# Patient Record
Sex: Female | Born: 1975 | Race: Black or African American | Hispanic: No | Marital: Married | Smoking: Never smoker
Health system: Southern US, Community
[De-identification: ages and names within clinical notes are randomized; demographics above are authoritative.]

## PROBLEM LIST (undated history)

## (undated) DIAGNOSIS — D219 Benign neoplasm of connective and other soft tissue, unspecified: Secondary | ICD-10-CM

## (undated) DIAGNOSIS — T7840XA Allergy, unspecified, initial encounter: Secondary | ICD-10-CM

## (undated) DIAGNOSIS — Z789 Other specified health status: Secondary | ICD-10-CM

## (undated) DIAGNOSIS — D649 Anemia, unspecified: Secondary | ICD-10-CM

## (undated) DIAGNOSIS — E079 Disorder of thyroid, unspecified: Secondary | ICD-10-CM

## (undated) HISTORY — PX: NO PAST SURGERIES: SHX2092

## (undated) HISTORY — PX: OTHER SURGICAL HISTORY: SHX169

## (undated) HISTORY — DX: Anemia, unspecified: D64.9

## (undated) HISTORY — DX: Disorder of thyroid, unspecified: E07.9

## (undated) HISTORY — PX: WISDOM TOOTH EXTRACTION: SHX21

## (undated) HISTORY — DX: Allergy, unspecified, initial encounter: T78.40XA

## (undated) HISTORY — DX: Benign neoplasm of connective and other soft tissue, unspecified: D21.9

---

## 2013-05-27 LAB — OB RESULTS CONSOLE HIV ANTIBODY (ROUTINE TESTING): HIV: NONREACTIVE

## 2013-05-27 LAB — OB RESULTS CONSOLE GC/CHLAMYDIA
CHLAMYDIA, DNA PROBE: NEGATIVE
Gonorrhea: NEGATIVE

## 2013-05-27 LAB — OB RESULTS CONSOLE RUBELLA ANTIBODY, IGM: RUBELLA: IMMUNE

## 2013-05-27 LAB — OB RESULTS CONSOLE ABO/RH: RH Type: POSITIVE

## 2013-05-27 LAB — OB RESULTS CONSOLE RPR: RPR: NONREACTIVE

## 2013-05-27 LAB — OB RESULTS CONSOLE ANTIBODY SCREEN: Antibody Screen: NEGATIVE

## 2013-05-27 LAB — OB RESULTS CONSOLE HEPATITIS B SURFACE ANTIGEN: Hepatitis B Surface Ag: NEGATIVE

## 2013-07-20 ENCOUNTER — Other Ambulatory Visit: Payer: Self-pay | Admitting: Obstetrics and Gynecology

## 2013-07-20 DIAGNOSIS — N63 Unspecified lump in unspecified breast: Secondary | ICD-10-CM

## 2013-07-22 ENCOUNTER — Other Ambulatory Visit: Payer: Self-pay

## 2013-07-28 NOTE — L&D Delivery Note (Signed)
After admission the pt had pit aug added.She had an epidural placed. She developed late decels when she was 5 cm. She then rapidly completed the first stage. She pushed for 17min and had a SVD of one live viable black female in the ROA position over a second degree mid line tear. PlacentaS/I, short cord, sent to path. Tear closed with 3-0 chromic. EBL-400cc Baby to NBN.

## 2013-08-01 ENCOUNTER — Ambulatory Visit
Admission: RE | Admit: 2013-08-01 | Discharge: 2013-08-01 | Disposition: A | Discharge status: Home / Self Care | Payer: Commercial Managed Care - PPO | Source: Ambulatory Visit | Attending: Obstetrics and Gynecology | Admitting: Obstetrics and Gynecology

## 2013-08-01 ENCOUNTER — Other Ambulatory Visit: Payer: Self-pay | Admitting: Obstetrics and Gynecology

## 2013-08-01 DIAGNOSIS — N63 Unspecified lump in unspecified breast: Secondary | ICD-10-CM

## 2013-11-22 LAB — OB RESULTS CONSOLE GBS: GBS: NEGATIVE

## 2013-12-20 ENCOUNTER — Telehealth (HOSPITAL_COMMUNITY): Payer: Self-pay | Admitting: *Deleted

## 2013-12-20 ENCOUNTER — Encounter (HOSPITAL_COMMUNITY): Payer: Self-pay | Admitting: *Deleted

## 2013-12-20 NOTE — Telephone Encounter (Signed)
Preadmission screen  

## 2013-12-21 ENCOUNTER — Encounter (HOSPITAL_COMMUNITY): Payer: Self-pay | Admitting: *Deleted

## 2013-12-21 ENCOUNTER — Inpatient Hospital Stay (HOSPITAL_COMMUNITY)
Admission: AD | Admit: 2013-12-21 | Discharge: 2013-12-24 | DRG: 775 | Disposition: A | Discharge status: Home / Self Care | Payer: 59 | Source: Ambulatory Visit | Attending: Obstetrics and Gynecology | Admitting: Obstetrics and Gynecology

## 2013-12-21 DIAGNOSIS — Z3480 Encounter for supervision of other normal pregnancy, unspecified trimester: Secondary | ICD-10-CM

## 2013-12-21 DIAGNOSIS — Z3490 Encounter for supervision of normal pregnancy, unspecified, unspecified trimester: Secondary | ICD-10-CM

## 2013-12-21 DIAGNOSIS — D4959 Neoplasm of unspecified behavior of other genitourinary organ: Secondary | ICD-10-CM | POA: Diagnosis present

## 2013-12-21 DIAGNOSIS — O480 Post-term pregnancy: Secondary | ICD-10-CM

## 2013-12-21 DIAGNOSIS — O411090 Infection of amniotic sac and membranes, unspecified, unspecified trimester, not applicable or unspecified: Secondary | ICD-10-CM | POA: Diagnosis present

## 2013-12-21 DIAGNOSIS — O09519 Supervision of elderly primigravida, unspecified trimester: Secondary | ICD-10-CM | POA: Diagnosis present

## 2013-12-21 DIAGNOSIS — D259 Leiomyoma of uterus, unspecified: Secondary | ICD-10-CM | POA: Diagnosis present

## 2013-12-21 DIAGNOSIS — O3410 Maternal care for benign tumor of corpus uteri, unspecified trimester: Secondary | ICD-10-CM

## 2013-12-21 DIAGNOSIS — IMO0001 Reserved for inherently not codable concepts without codable children: Secondary | ICD-10-CM

## 2013-12-21 DIAGNOSIS — O34599 Maternal care for other abnormalities of gravid uterus, unspecified trimester: Secondary | ICD-10-CM | POA: Diagnosis present

## 2013-12-21 HISTORY — DX: Other specified health status: Z78.9

## 2013-12-21 LAB — RPR

## 2013-12-21 LAB — CBC
HCT: 36.4 % (ref 36.0–46.0)
Hemoglobin: 12.8 g/dL (ref 12.0–15.0)
MCH: 31.1 pg (ref 26.0–34.0)
MCHC: 35.2 g/dL (ref 30.0–36.0)
MCV: 88.3 fL (ref 78.0–100.0)
PLATELETS: 166 10*3/uL (ref 150–400)
RBC: 4.12 MIL/uL (ref 3.87–5.11)
RDW: 13.6 % (ref 11.5–15.5)
WBC: 14.7 10*3/uL — AB (ref 4.0–10.5)

## 2013-12-21 MED ORDER — TERBUTALINE SULFATE 1 MG/ML IJ SOLN: 0.2500 mg | Freq: Once | INTRAMUSCULAR | Status: AC | PRN

## 2013-12-21 MED ORDER — CLINDAMYCIN PHOSPHATE 900 MG/50ML IV SOLN
900.0000 mg | Freq: Three times a day (TID) | INTRAVENOUS | Status: DC
  Administered 2013-12-21 – 2013-12-22 (×2): 900 mg via INTRAVENOUS
  Filled 2013-12-21 (×3): qty 50

## 2013-12-21 MED ORDER — ACETAMINOPHEN 325 MG PO TABS
650.0000 mg | ORAL_TABLET | ORAL | Status: DC | PRN
  Administered 2013-12-21 – 2013-12-22 (×2): 650 mg via ORAL
  Filled 2013-12-21 (×3): qty 2

## 2013-12-21 MED ORDER — FLEET ENEMA 7-19 GM/118ML RE ENEM: 1.0000 | ENEMA | RECTAL | Status: DC | PRN

## 2013-12-21 MED ORDER — EPHEDRINE 5 MG/ML INJ
10.0000 mg | INTRAVENOUS | Status: DC | PRN
  Administered 2013-12-22: 10 mg via INTRAVENOUS
  Filled 2013-12-21: qty 2

## 2013-12-21 MED ORDER — LACTATED RINGERS IV SOLN
INTRAVENOUS | Status: DC
  Administered 2013-12-21: 300 mL via INTRAVENOUS

## 2013-12-21 MED ORDER — LACTATED RINGERS IV SOLN
500.0000 mL | Freq: Once | INTRAVENOUS | Status: AC
  Administered 2013-12-21: 500 mL via INTRAVENOUS

## 2013-12-21 MED ORDER — OXYTOCIN 40 UNITS IN LACTATED RINGERS INFUSION - SIMPLE MED: 62.5000 mL/h | INTRAVENOUS | Status: DC

## 2013-12-21 MED ORDER — PHENYLEPHRINE 40 MCG/ML (10ML) SYRINGE FOR IV PUSH (FOR BLOOD PRESSURE SUPPORT)
80.0000 ug | PREFILLED_SYRINGE | INTRAVENOUS | Status: DC | PRN
  Administered 2013-12-22: 80 ug via INTRAVENOUS
  Filled 2013-12-21: qty 2

## 2013-12-21 MED ORDER — PHENYLEPHRINE 40 MCG/ML (10ML) SYRINGE FOR IV PUSH (FOR BLOOD PRESSURE SUPPORT)
80.0000 ug | PREFILLED_SYRINGE | INTRAVENOUS | Status: DC | PRN
  Filled 2013-12-21: qty 10
  Filled 2013-12-21: qty 2

## 2013-12-21 MED ORDER — LACTATED RINGERS IV SOLN
500.0000 mL | INTRAVENOUS | Status: DC | PRN
  Administered 2013-12-21: 1000 mL via INTRAVENOUS
  Administered 2013-12-22: 500 mL via INTRAVENOUS

## 2013-12-21 MED ORDER — LIDOCAINE HCL (PF) 1 % IJ SOLN
30.0000 mL | INTRAMUSCULAR | Status: DC | PRN
  Filled 2013-12-21: qty 30

## 2013-12-21 MED ORDER — FENTANYL 2.5 MCG/ML BUPIVACAINE 1/10 % EPIDURAL INFUSION (WH - ANES)
14.0000 mL/h | INTRAMUSCULAR | Status: DC | PRN
  Administered 2013-12-22: 14 mL/h via EPIDURAL
  Filled 2013-12-21: qty 125

## 2013-12-21 MED ORDER — IBUPROFEN 600 MG PO TABS
600.0000 mg | ORAL_TABLET | Freq: Four times a day (QID) | ORAL | Status: DC | PRN
  Administered 2013-12-22: 600 mg via ORAL
  Filled 2013-12-21: qty 1

## 2013-12-21 MED ORDER — EPHEDRINE 5 MG/ML INJ
10.0000 mg | INTRAVENOUS | Status: DC | PRN
  Filled 2013-12-21: qty 2
  Filled 2013-12-21: qty 4

## 2013-12-21 MED ORDER — CITRIC ACID-SODIUM CITRATE 334-500 MG/5ML PO SOLN
30.0000 mL | ORAL | Status: DC | PRN
  Filled 2013-12-21: qty 15

## 2013-12-21 MED ORDER — OXYTOCIN 40 UNITS IN LACTATED RINGERS INFUSION - SIMPLE MED
1.0000 m[IU]/min | INTRAVENOUS | Status: DC
  Administered 2013-12-21: 2 m[IU]/min via INTRAVENOUS
  Filled 2013-12-21: qty 1000

## 2013-12-21 MED ORDER — OXYTOCIN BOLUS FROM INFUSION
500.0000 mL | INTRAVENOUS | Status: DC
  Administered 2013-12-22: 500 mL via INTRAVENOUS

## 2013-12-21 MED ORDER — DIPHENHYDRAMINE HCL 50 MG/ML IJ SOLN: 12.5000 mg | INTRAMUSCULAR | Status: DC | PRN

## 2013-12-21 MED ORDER — ONDANSETRON HCL 4 MG/2ML IJ SOLN: 4.0000 mg | Freq: Four times a day (QID) | INTRAMUSCULAR | Status: DC | PRN

## 2013-12-21 MED ORDER — OXYCODONE-ACETAMINOPHEN 5-325 MG PO TABS: 1.0000 | ORAL_TABLET | ORAL | Status: DC | PRN

## 2013-12-21 NOTE — MAU Note (Signed)
PT HA SA MARK UNDER LEFT EYE-  SHE SAYS IT  IS A BIRTHMARK   THAT FADED AWAY AS A CHILD AND THEN CAME BACK WITH PREG..   DENIES  ANY ABUSE

## 2013-12-21 NOTE — MAU Note (Signed)
PT SAYS SHE   HAD BAD UC    AT 1PM.-  BLOODY SHOW.  YESTERDAY - IN   OFFICE .  DR ROSS-   1 CM    DENIES HSV AND MRSA.  GBS- NEG.

## 2013-12-21 NOTE — MAU Note (Signed)
Patient states she is having contractions every 4-5 minutes. Denies bleeding or leaking. Reports having membranes sweeped yesterday and scheduled for IOL on Monday. Reports good fetal movement.

## 2013-12-21 NOTE — H&P (Signed)
Pt is a 38 yr old black female, G1P0 at term who presents to the ER with abd pain.When she arrived she had regular contractions. Her cx was 70/2/-2 vtx. She also had SROM. It is noted that her temp was 101.6 and the FHTs were elevated. She did have emesis earlier today. Her uterus is non tender. She did have her membranes stripped in the office yesterday. GBS- negative. PNC was complicated by AMA- normal NIPT, Uterine fibroids PE: Temp 101.6 FHTs 160-170s Abd-gravid, non tender, palp cotractions Pelvic-ext-wnl            Vagina- +pool, +fern, AF-yellow, ?light mec.            Cx-70/2/-2 IMP/ IUP at term with SROM, elevated FHTs, maternal temp. Most likely with chorio but ut is non tender. Given that she did have emesis this am and that her WBC is not significantly elevated, she may have a viral gastroenteritis. PLAN/ Will admit and start abxs, Augment labor if needed.

## 2013-12-22 ENCOUNTER — Encounter (HOSPITAL_COMMUNITY): Payer: 59 | Admitting: Anesthesiology

## 2013-12-22 ENCOUNTER — Encounter (HOSPITAL_COMMUNITY): Payer: Self-pay | Admitting: Obstetrics

## 2013-12-22 ENCOUNTER — Observation Stay (HOSPITAL_COMMUNITY): Payer: 59 | Admitting: Anesthesiology

## 2013-12-22 DIAGNOSIS — IMO0001 Reserved for inherently not codable concepts without codable children: Secondary | ICD-10-CM

## 2013-12-22 DIAGNOSIS — Z3480 Encounter for supervision of other normal pregnancy, unspecified trimester: Secondary | ICD-10-CM

## 2013-12-22 LAB — TYPE AND SCREEN
ABO/RH(D): A POS
ANTIBODY SCREEN: NEGATIVE

## 2013-12-22 LAB — ABO/RH: ABO/RH(D): A POS

## 2013-12-22 MED ORDER — ONDANSETRON HCL 4 MG/2ML IJ SOLN: 4.0000 mg | INTRAMUSCULAR | Status: DC | PRN

## 2013-12-22 MED ORDER — WITCH HAZEL-GLYCERIN EX PADS
1.0000 | MEDICATED_PAD | CUTANEOUS | Status: DC | PRN
Start: 2013-12-22 — End: 2013-12-24

## 2013-12-22 MED ORDER — DIBUCAINE 1 % RE OINT: 1.0000 "application " | TOPICAL_OINTMENT | RECTAL | Status: DC | PRN

## 2013-12-22 MED ORDER — SENNOSIDES-DOCUSATE SODIUM 8.6-50 MG PO TABS
2.0000 | ORAL_TABLET | ORAL | Status: DC
  Administered 2013-12-24: 2 via ORAL
  Filled 2013-12-22 (×2): qty 2

## 2013-12-22 MED ORDER — MEASLES, MUMPS & RUBELLA VAC ~~LOC~~ INJ
0.5000 mL | INJECTION | Freq: Once | SUBCUTANEOUS | Status: DC
Start: 2013-12-23 — End: 2013-12-24
  Filled 2013-12-22: qty 0.5

## 2013-12-22 MED ORDER — OXYCODONE-ACETAMINOPHEN 5-325 MG PO TABS: 1.0000 | ORAL_TABLET | ORAL | Status: DC | PRN

## 2013-12-22 MED ORDER — ONDANSETRON HCL 4 MG PO TABS: 4.0000 mg | ORAL_TABLET | ORAL | Status: DC | PRN

## 2013-12-22 MED ORDER — BENZOCAINE-MENTHOL 20-0.5 % EX AERO
1.0000 "application " | INHALATION_SPRAY | CUTANEOUS | Status: DC | PRN
  Administered 2013-12-23: 1 via TOPICAL
  Filled 2013-12-22: qty 56

## 2013-12-22 MED ORDER — ZOLPIDEM TARTRATE 5 MG PO TABS: 5.0000 mg | ORAL_TABLET | Freq: Every evening | ORAL | Status: DC | PRN

## 2013-12-22 MED ORDER — IBUPROFEN 600 MG PO TABS
600.0000 mg | ORAL_TABLET | Freq: Four times a day (QID) | ORAL | Status: DC
  Administered 2013-12-22 – 2013-12-24 (×7): 600 mg via ORAL
  Filled 2013-12-22 (×10): qty 1

## 2013-12-22 MED ORDER — TETANUS-DIPHTH-ACELL PERTUSSIS 5-2.5-18.5 LF-MCG/0.5 IM SUSP: 0.5000 mL | Freq: Once | INTRAMUSCULAR | Status: DC

## 2013-12-22 MED ORDER — LIDOCAINE HCL (PF) 1 % IJ SOLN
INTRAMUSCULAR | Status: DC | PRN
  Administered 2013-12-22 (×4): 4 mL

## 2013-12-22 MED ORDER — SIMETHICONE 80 MG PO CHEW: 80.0000 mg | CHEWABLE_TABLET | ORAL | Status: DC | PRN

## 2013-12-22 NOTE — Anesthesia Procedure Notes (Signed)
Epidural Patient location during procedure: OB Start time: 12/22/2013 12:10 AM  Staffing Performed by: anesthesiologist   Preanesthetic Checklist Completed: patient identified, site marked, surgical consent, pre-op evaluation, timeout performed, IV checked, risks and benefits discussed and monitors and equipment checked  Epidural Patient position: sitting Prep: site prepped and draped and DuraPrep Patient monitoring: continuous pulse ox and blood pressure Approach: midline Injection technique: LOR air  Needle:  Needle type: Tuohy  Needle gauge: 17 G Needle length: 9 cm and 9 Needle insertion depth: 5.5 cm Catheter type: closed end flexible Catheter size: 19 Gauge Catheter at skin depth: 10.5 cm Test dose: negative  Assessment Events: blood not aspirated, injection not painful, no injection resistance, negative IV test and no paresthesia  Additional Notes Discussed risk of headache, infection, bleeding, nerve injury and failed or incomplete block.  Patient voices understanding and wishes to proceed.  Epidural placed easily on first attempt.  No paresthesia.  Patient tolerated procedure well with no apparent complications.  Charlton Haws, MDReason for block:procedure for pain

## 2013-12-22 NOTE — Anesthesia Preprocedure Evaluation (Signed)
Anesthesia Evaluation  Patient identified by MRN, date of birth, ID band Patient awake    Reviewed: Allergy & Precautions, H&P , NPO status , Patient's Chart, lab work & pertinent test results, reviewed documented beta blocker date and time   History of Anesthesia Complications Negative for: history of anesthetic complications  Airway Mallampati: IV TM Distance: >3 FB Neck ROM: full    Dental  (+) Teeth Intact   Pulmonary neg pulmonary ROS,  breath sounds clear to auscultation        Cardiovascular negative cardio ROS  Rhythm:regular Rate:Normal     Neuro/Psych negative neurological ROS  negative psych ROS   GI/Hepatic negative GI ROS, Neg liver ROS,   Endo/Other  negative endocrine ROS  Renal/GU negative Renal ROS     Musculoskeletal   Abdominal   Peds  Hematology negative hematology ROS (+)   Anesthesia Other Findings   Reproductive/Obstetrics (+) Pregnancy                           Anesthesia Physical Anesthesia Plan  ASA: II  Anesthesia Plan: Epidural   Post-op Pain Management:    Induction:   Airway Management Planned:   Additional Equipment:   Intra-op Plan:   Post-operative Plan:   Informed Consent: I have reviewed the patients History and Physical, chart, labs and discussed the procedure including the risks, benefits and alternatives for the proposed anesthesia with the patient or authorized representative who has indicated his/her understanding and acceptance.     Plan Discussed with:   Anesthesia Plan Comments:         Anesthesia Quick Evaluation

## 2013-12-22 NOTE — Lactation Note (Signed)
This note was copied from the chart of Lauren Charina Portugal. Lactation Consultation Note    Initial consult with this mom of a term baby, now 48 hours old, fed twice at breast, but mom has small breast, soft, with no expressable colostrum. I assisted mom with latching baby, and she was too sleepy to latch. Even with 16 nipple shield, baby would not suckle. I then added formula into shield, with curved tip syringe, and she gradually began sucking and more interested. It took her about 15 minutes or more to finish 7 mls at the breast. Baby continued to suckle after after formula fed, with nipple shield I set up a DEP for mom, decreased her to 21 flanges, and showed her how to set premie setting. Mom advised to pump every 3 hours, for 15 minutes, and to try hand expression after. Mom will need help with applying shield, and giving formula via syringe. Dad will probably be available to help with feedings.   Patient Name: Lauren Guerrero Today's Date: 12/22/2013 Reason for consult: Initial assessment   Maternal Data Formula Feeding for Exclusion: No Infant to breast within first hour of birth: Yes Has patient been taught Hand Expression?: Yes Does the patient have breastfeeding experience prior to this delivery?: No  Feeding Feeding Type: Formula Length of feed: 30 min  LATCH Score/Interventions Latch: Repeated attempts needed to sustain latch, nipple held in mouth throughout feeding, stimulation needed to elicit sucking reflex. Intervention(s): Adjust position;Assist with latch;Breast compression (used 16 nipple shiled filled with formula)  Audible Swallowing: None Intervention(s): Skin to skin;Hand expression  Type of Nipple: Flat Intervention(s): Double electric pump (NS)  Comfort (Breast/Nipple): Soft / non-tender     Hold (Positioning): Assistance needed to correctly position infant at breast and maintain latch. Intervention(s): Breastfeeding basics reviewed;Support Pillows;Position  options;Skin to skin (feeding amounts chart reviewed with mom)  LATCH Score: 5  Lactation Tools Discussed/Used Tools: Pump;Nipple [place] Fuel;Flanges Nipple shield size: 16 Flange Size: Other (comment) (21 flanges) Breast pump type: Double-Electric Breast Pump WIC Program: No (dad a cone emplyee. UMR, will get fee DEP for MOM) Pump Review: Setup, frequency, and cleaning;Milk Storage;Other (comment) (premeie setting) Initiated by:: clee RN at 13 hours post partum Date initiated:: 12/22/13   Consult Status Consult Status: Follow-up Date: 12/23/13 Follow-up type: In-patient    Tonna Corner 12/22/2013, 3:39 PM

## 2013-12-22 NOTE — Anesthesia Postprocedure Evaluation (Signed)
  Anesthesia Post-op Note  Patient: Lauren Guerrero  Procedure(s) Performed: * No procedures listed *  Patient Location: Mother/Baby  Anesthesia Type:Epidural  Level of Consciousness: awake  Airway and Oxygen Therapy: Patient Spontanous Breathing  Post-op Pain: none  Post-op Assessment: Patient's Cardiovascular Status Stable, Respiratory Function Stable, Patent Airway, No signs of Nausea or vomiting, Adequate PO intake, Pain level controlled and No headache  Post-op Vital Signs: Reviewed and stable  Last Vitals:  Filed Vitals:   12/22/13 0600  BP: 97/59  Pulse: 104  Temp: 36.9 C  Resp: 20    Complications: No apparent anesthesia complications

## 2013-12-23 LAB — CBC
HCT: 29.2 % — ABNORMAL LOW (ref 36.0–46.0)
Hemoglobin: 10.2 g/dL — ABNORMAL LOW (ref 12.0–15.0)
MCH: 30.6 pg (ref 26.0–34.0)
MCHC: 34.9 g/dL (ref 30.0–36.0)
MCV: 87.7 fL (ref 78.0–100.0)
PLATELETS: 145 10*3/uL — AB (ref 150–400)
RBC: 3.33 MIL/uL — ABNORMAL LOW (ref 3.87–5.11)
RDW: 13.8 % (ref 11.5–15.5)
WBC: 18.3 10*3/uL — ABNORMAL HIGH (ref 4.0–10.5)

## 2013-12-23 NOTE — Progress Notes (Signed)
Post Partum Day 1 Subjective: no complaints, up ad lib, voiding and tolerating PO  Objective: Blood pressure 100/61, pulse 94, temperature 98.1 F (36.7 C), temperature source Oral, resp. rate 16, height 5' 5.5" (1.664 m), weight 78.926 kg (174 lb), last menstrual period 03/13/2013, SpO2 96.00%, unknown if currently breastfeeding.  Physical Exam:  General: alert, cooperative and appears stated age 38: appropriate Uterine Fundus: firm   Recent Labs  12/21/13 1610 12/23/13 0605  HGB 12.8 10.2*  HCT 36.4 29.2*    Assessment/Plan: Plan for discharge tomorrow Breastfeeding Fever resolved   LOS: 2 days   Ezinne Yogi H. Tzivia Oneil 12/23/2013, 10:00 AM

## 2013-12-24 MED ORDER — OXYCODONE-ACETAMINOPHEN 5-325 MG PO TABS: 2.0000 | ORAL_TABLET | ORAL | Status: DC | PRN

## 2013-12-24 MED ORDER — IBUPROFEN 600 MG PO TABS: 600.0000 mg | ORAL_TABLET | Freq: Four times a day (QID) | ORAL | Status: DC | PRN

## 2013-12-24 MED ORDER — DOCUSATE SODIUM 100 MG PO CAPS: 100.0000 mg | ORAL_CAPSULE | Freq: Two times a day (BID) | ORAL | Status: DC

## 2013-12-24 NOTE — Discharge Summary (Signed)
Obstetric Discharge Summary Reason for Admission: onset of labor Prenatal Procedures: NST and ultrasound Intrapartum Procedures: spontaneous vaginal delivery Postpartum Procedures: none Complications-Operative and Postpartum: 2nd degree perineal laceration, intrapartum fever Hemoglobin  Date Value Ref Range Status  12/23/2013 10.2* 12.0 - 15.0 g/dL Final     REPEATED TO VERIFY     DELTA CHECK NOTED     HCT  Date Value Ref Range Status  12/23/2013 29.2* 36.0 - 46.0 % Final    Physical Exam:  General: alert, cooperative and appears stated age 38: appropriate Uterine Fundus: firm Incision: healing well DVT Evaluation: No evidence of DVT seen on physical exam.  Discharge Diagnoses: Term Pregnancy-delivered and Amnionitis  Discharge Information: Date: 12/24/2013 Activity: pelvic rest Diet: routine Medications: Ibuprofen, Colace and Percocet Condition: improved Instructions: refer to practice specific booklet Discharge to: home Follow-up Information   Follow up with Olga Millers, MD In 4 weeks. (In 4-6 weeks for a post-partum evaluation)    Specialty:  Obstetrics and Gynecology   Contact information:   [place] [phone]       Newborn Data: Live born female  Birth Weight: 7 lb 0.7 oz (3195 g) APGAR: 6, 9  Home with mother.  Lauren Guerrero. Balinda Heacock 12/24/2013, 9:02 AM

## 2013-12-25 ENCOUNTER — Inpatient Hospital Stay (HOSPITAL_COMMUNITY): Admission: RE | Admit: 2013-12-25 | Payer: Commercial Managed Care - PPO | Source: Ambulatory Visit

## 2013-12-28 ENCOUNTER — Ambulatory Visit: Payer: Self-pay

## 2013-12-28 NOTE — Lactation Note (Signed)
This note was copied from the chart of Dideolu Jegede. Infant Lactation Consultation Outpatient Visit Note  Patient Name: Lauren Guerrero Date of Birth: 12/22/2012 Birth Weight:  7 lb 0.7 oz (3195 g) Gestational Age at Delivery: Gestational Age: [redacted]w[redacted]d Type of Delivery: SVB  Breastfeeding History Frequency of Breastfeeding: every 2-4 hours without nipple shield, 1 breast each feeding Length of Feeding: 40 minutes Voids: 10/day Stools: 3/day, yellow in color.   Supplementing / Method: Pumping:  Type of Pump:  Harmony HP and Medela PNS   Frequency:  Has not been pumping  Volume:  none  Comments: Mom here for feeding assessment, help with latch and to instruct Mom on pumping. She has now received her Medela DEBP. However, Mom was 30 minutes late for her appointment, Baby was fed at 12:00 and she did not bring her DEBP, Mom brought her Harmony HP. Mom has been supplementing baby every 2-4 hours with 2 oz of Enfamil. She had very sore nipples when left the hospital. She has been using All Purpose Nipple Cream and reports her nipples are healing. She has been BF without the nipple shield on 1 breast each feeding for 40 minutes every 2-4 hours. Last visit with Peds on Monday or Tuesday, Mom reports Peds instructed her to decrease the amount of supplement by half and increase breastfeeding and to continue this till milk is in and baby feeding frequently. Mom reports her milk came in 2 days ago.    Consultation Evaluation: Mom's nipples are still cracked, but no bleeding observed with feeding today. Baby sleepy at the breast and had recently fed prior to her OP appointment. Therefore, baby was not very active at the breast. Breast soft.   Initial Feeding Assessment: Pre-feed Weight:  7 lb. 3.9 oz/3286 gm Post-feed Weight:  7 lb. 4.3 oz/3296 gm Amount Transferred:  10 ml Comments:  From left breast in cross cradle hold. Mom is able to use breast compression and obtain what appears to be deep  latch. Baby started out with good suckling rhythm with some audible swallows but then became very sleepy at the breast, some non-nutritive suckling observed. Baby was nursing with stimulation for 15 minutes. Mom denied any discomfort. Nipple round when baby came off the breast.   Additional Feeding Assessment: Pre-feed Weight:   7 lb. 4.3 oz/3296 gm Post-feed Weight:  3298 gm.  Amount Transferred:  2 ml.  Comments:  From right breast in cross cradle hold. Mom denies any discomfort with baby at the breast. Baby did not transfer much at this breast, very sleepy. Feel this feeding is affected by recent feeding before visit.   Additional Feeding Assessment: Pre-feed Weight: Post-feed Weight: Amount Transferred: Comments:  Total Breast milk Transferred this Visit: 12 ml.  Total Supplement Given: None, Baby recently fed at 12:00 prior to OP appointment.  Additional Interventions: Discussed feeding plan with Mom till we can see her again next week. Advised not to feed baby so close to OP appointment so baby will be ready to eat. Be on time for appointment or we may have to re-schedule.  Advised to BF every  2-3 hours, keep baby active at the breast for 15-20 minutes each feeding, both breast each feeding.  Post pump after feedings for 15-20 minutes.  Supplement with EBM or formula 45 ml with each feeding till we can see her again on Monday, 01/02/14 and re-assess pre/post weight and assess weight gain. Mom latched baby well today using breast compression, nipple was round when  baby came off the breast. Continue APNC till nipples heal. Limit time at each breast to 30 minutes. No soap on breast, Can use olive oil or coconut oil if dry. Demonstrated how to use hand pump and clean parts. Using display pump, demonstrated to Mom how to set up her DEBP at home and what parts to clean. Mom to bring DEBP to next visit, along with EBM if needed for supplement.  Pump/storage guidelines reviewed. Refer to page 25  of Baby N Me Booklet    Follow-Up Peds on Friday, 12/30/13 OP Lactation f/u on Monday, 01/02/14 at [facility] 12/28/2013, 4:26 PM

## 2014-01-02 ENCOUNTER — Ambulatory Visit (HOSPITAL_COMMUNITY)
Admission: RE | Admit: 2014-01-02 | Discharge: 2014-01-02 | Disposition: A | Discharge status: Home / Self Care | Payer: 59 | Source: Ambulatory Visit | Attending: Obstetrics and Gynecology | Admitting: Obstetrics and Gynecology

## 2014-01-02 NOTE — Lactation Note (Addendum)
Adult Lactation Consultation Outpatient Visit Note   Patient Name: Lauren Guerrero                                                           "[name]" Date of Birth: Oct 26, 1975                                                                       12 days Gestational Age at Delivery: Unknown                                                 Weight today: [phone] Type of Delivery:                                                                                   3 ounce gain in 5 days  Breastfeeding History: Frequency of Breastfeeding: every 1-2 hours Length of Feeding: 30 mins on each Voids: QS Stools: QS  Supplementing / Method: Pumping:  Type of Pump:Pump N Style   Frequency:none, mother states that she has had too much company to begin pumping  Volume:    Comments:Mother has been using APNO, She still has some cracking at back of both nipple shafts. Mother states that she has had so much company from out of town that she has been unable to start pumping. Mother has been breast feedng infant for 30 mins on each breast. She is giving infant 2 1/2 ounces of formula every 2-3 hours. Infant had a 3 ounce gain in 5 days.    Consultation Evaluation: observed mother latch infant. Infant goes on with a shallow latch. We worked on [employer] and infant was able to get better depth. Infant suckles with lots of non nutritive suckling and intermittent swallows for 20 min. Infant transferred 6 ml.   Observed that infant does have a tight upper lip tie. Repeatedly needing to roll lip up and adjust lower jaw. Recommend that if mothers nipples continue to be cracked, to have lip tie clipped.  Initial Feeding Assessment: Pre-feed Weight:3392 Post-feed LTJQZE:0923 Amount Transferred:6 ml Comments:  Additional Feeding Assessment: SNS was sat up on alternate breast with 60 ml of formula. Infant took the entire amt well.  Pre-feed RAQTMA:2633 Post-feed HLKTGY:5638 Amount Transferred:60  ml Comments:   Total Breast milk Transferred this Visit: 6 ml Total Supplement Given: 46ml from SNS and 30 ml with a bottle  Additional Interventions: Recommend that mother use SNS every feeding  Offer infant at least 2 1/2-3 ounces every 2-3 hours If infant doesn't take entire feeding from SNS, offer bottle with the remainder of feeding  Advised mother to begin pumping  Pump at least 6-8 times daily for 20 mins Tips for making milk. Suggested supplement, fenugreek,etc Mother to follow up with [facility] in one week   Follow-Up  June 15 at 2:30    [facility] 01/02/2014, 4:19 PM

## 2014-01-04 ENCOUNTER — Other Ambulatory Visit: Payer: Self-pay | Admitting: Internal Medicine

## 2014-01-05 ENCOUNTER — Other Ambulatory Visit: Payer: Self-pay | Admitting: Internal Medicine

## 2014-01-05 DIAGNOSIS — M545 Low back pain, unspecified: Secondary | ICD-10-CM

## 2014-01-06 ENCOUNTER — Other Ambulatory Visit: Payer: Self-pay | Admitting: Internal Medicine

## 2014-01-06 MED ORDER — CYCLOBENZAPRINE HCL 10 MG PO TABS: 10.0000 mg | ORAL_TABLET | Freq: Three times a day (TID) | ORAL | Status: DC | PRN

## 2014-01-09 ENCOUNTER — Ambulatory Visit (HOSPITAL_COMMUNITY)
Admission: RE | Admit: 2014-01-09 | Discharge: 2014-01-09 | Disposition: A | Discharge status: Home / Self Care | Payer: 59 | Source: Ambulatory Visit | Attending: Obstetrics and Gynecology | Admitting: Obstetrics and Gynecology

## 2014-01-09 NOTE — Lactation Note (Addendum)
Adult Lactation Consultation Outpatient Visit Note  Patient Name: Lauren Guerrero                                        Baby Dideolu Jejede Date of Birth: 11-19-1975                                                  DOB [birth year], BW 7 lb. 0.7 oz Gestational Age at Delivery: Unknown                            Now 70 weeks old Type of Delivery: SVB  Breastfeeding History: Frequency of Breastfeeding: every 2-3 hours Length of Feeding: 20 minutes each breast Voids:  10 per day Stools:  2-3/day, yellow in color  Supplementing / Method: Pumping:  Type of Pump:  Medela PNS   Frequency:  Mom is pre-pumping prior to each feeding   Volume:  10-15 ml. From each breast.   Comments: Mom is here for feeding assessment from f/u OP lactation visit, 12/28/13. Mom continues to complain of sore nipples. Using [facility] for cracking on right nipple and soreness bilateral. Mom reports no bleeding with breastfeeding. Mom started Fenugreek for LMS and reports taking 3 tabs QD.    Consultation Evaluation: On exam, Mom's breast are soft bilateral. Right nipple has some superficial cracks, no scabs or bleeding noted. Left nipple intact. Baby is noted to have short posterior frenulum that is restricting upward and side to side tongue mobility. The upper lip labial frenulum also attaches at the alveolar ridge resulting in upper lip not flanging well.   Initial Feeding Assessment: Pre-feed Weight:  7 lb. 15.1 oz/3602 gm Post-feed Weight:  7 lb. 15.2 oz/3608 gm Amount Transferred:  6 ml Comments:  From right breast with nursing for 15 minutes. Mom needed Homeland assist to obtain more depth with latch. Mom reported pain with initial latch that resolved after the baby nursed. Baby demonstrated off/on a good rhythmic suck, however LC was not hearing audible swallows in spite of what appeared to be good latch. Mom's nipple has slight compression line visible at the end of the feeding.   Additional Feeding Assessment: Pre-feed Weight:   7 lb. 15.2 oz/3608 gm Post-feed Weight:  same Amount Transferred:  0 Comments:  Baby nursed for approx 10 minutes on the left breast in cross cradle but did not transfer any milk. Tried #20 nipple shield to see if baby could obtain more depth with latch and transfer milk. Initiated SNS with formula as supplement to use at breast with nipple shield. Baby however did not sustain good depth with nipple shield and Mom reported she felt stronger suckling without the nipple shield.   Additional Feeding Assessment: Pre-feed Weight:  7 lb. 15.2 oz/3608 gm Post-feed Weight:  8 lb. 0.2 oz/3634 gm Amount Transferred:  26 ml. Formula from SNS at breast Comments: From left breast using SNS at breast with formula. SNS set up with 30 ml of formula, baby transferred 26 ml of formula at the breast using SNS. Mom's nipple was round when baby came off the breast and she reported pain with initial latch that improved/resolved with baby nursing once baby obtained deep latch.  Total Breast milk Transferred this Visit: 47ml Total Supplement Given: 26 ml formula using SNS at breast, took another 60 ml of formula from bottle for total of 86 ml supplement.   Additional Interventions: Encouraged Mom to contact Peds for referral about frenotomy. Upper lip labial frenulum and posterior frenulum can result in poor sucking mechanism which affects milk transfer and milk production. Also will keep Mom sore.  Encouraged Mom to pre-pump 3-5 minutes to get milk flow going. Latch baby on 1st breast without SNS. Keep her nursing for 15-20 minutes. Then latch baby on 2nd breast using SNS to supplement with either EBM or formula with each feeding. Be sure baby has good depth w/latch and demonstrating a good suckling pattern.  Next feeding alternate the pattern so that each feeding you are alternating which breast is the 1st breast and which breast you use with the SNS. Supplement with 60 ml of EBM/formula with each feeding till we can  see you again and assess milk transfer. Can give more if baby wants more.  Post pump both breasts for 15-20 minutes to stimulate milk production. Continue Fenugreek but take 3 tabs TID Eat oatmeal every day, consider Lactation cookies. Can use bottle to supplement if SNS becomes too complicated. Mom to use double SNS at home. Mom reports she has double SNS and has been instructed on how to set up/clean.    Follow-Up Lactation OP f/u Wednesday, 01/18/14 at 1:00pm.      Katrine Coho 01/09/2014, 4:37 PM

## 2014-01-18 ENCOUNTER — Ambulatory Visit (HOSPITAL_COMMUNITY): Payer: Commercial Managed Care - PPO

## 2014-05-03 ENCOUNTER — Other Ambulatory Visit: Payer: Self-pay | Admitting: Internal Medicine

## 2014-05-03 DIAGNOSIS — S63501A Unspecified sprain of right wrist, initial encounter: Secondary | ICD-10-CM

## 2014-05-09 ENCOUNTER — Ambulatory Visit: Payer: Commercial Managed Care - PPO | Admitting: Occupational Therapy

## 2014-05-11 ENCOUNTER — Ambulatory Visit: Payer: 59 | Attending: Internal Medicine | Admitting: Occupational Therapy

## 2014-05-11 DIAGNOSIS — M25532 Pain in left wrist: Secondary | ICD-10-CM | POA: Insufficient documentation

## 2014-05-11 DIAGNOSIS — M25531 Pain in right wrist: Secondary | ICD-10-CM | POA: Insufficient documentation

## 2014-05-12 ENCOUNTER — Ambulatory Visit: Payer: 59 | Admitting: Occupational Therapy

## 2014-05-12 DIAGNOSIS — M25531 Pain in right wrist: Secondary | ICD-10-CM | POA: Diagnosis not present

## 2014-05-12 DIAGNOSIS — M25539 Pain in unspecified wrist: Secondary | ICD-10-CM | POA: Diagnosis present

## 2014-05-12 DIAGNOSIS — M25532 Pain in left wrist: Secondary | ICD-10-CM | POA: Diagnosis not present

## 2014-05-12 NOTE — Progress Notes (Signed)
[facility] assessment and treatment plan for bilateral wrist pain received.  Treatment plan signed by provider and faxed back as requested.

## 2014-05-16 ENCOUNTER — Ambulatory Visit: Payer: 59 | Admitting: Occupational Therapy

## 2014-05-16 DIAGNOSIS — M25532 Pain in left wrist: Secondary | ICD-10-CM | POA: Diagnosis not present

## 2014-05-23 ENCOUNTER — Ambulatory Visit: Payer: 59 | Admitting: Occupational Therapy

## 2014-05-23 DIAGNOSIS — M25532 Pain in left wrist: Secondary | ICD-10-CM | POA: Diagnosis not present

## 2014-05-25 ENCOUNTER — Encounter: Payer: Commercial Managed Care - PPO | Admitting: Occupational Therapy

## 2014-05-29 ENCOUNTER — Encounter (HOSPITAL_COMMUNITY): Payer: Self-pay | Admitting: Obstetrics

## 2014-05-30 ENCOUNTER — Ambulatory Visit: Payer: 59 | Admitting: Occupational Therapy

## 2014-06-06 ENCOUNTER — Ambulatory Visit: Payer: 59 | Attending: Internal Medicine | Admitting: Occupational Therapy

## 2014-06-06 ENCOUNTER — Encounter: Payer: Self-pay | Admitting: Occupational Therapy

## 2014-06-06 DIAGNOSIS — M25539 Pain in unspecified wrist: Secondary | ICD-10-CM

## 2014-06-06 DIAGNOSIS — M25532 Pain in left wrist: Secondary | ICD-10-CM | POA: Diagnosis not present

## 2014-06-06 DIAGNOSIS — M25531 Pain in right wrist: Secondary | ICD-10-CM | POA: Diagnosis not present

## 2014-06-06 DIAGNOSIS — M25649 Stiffness of unspecified hand, not elsewhere classified: Secondary | ICD-10-CM

## 2014-06-06 NOTE — Therapy (Signed)
Occupational Therapy Treatment  Patient Details  Name: Lauren Guerrero MRN: [id number] Date of Birth: 19-Aug-1975  Encounter Date: 06/06/2014      OT End of Session - 06/06/14 1240    Visit Number 4   Number of Visits 16   Date for OT Re-Evaluation 07/10/14   OT Start Time 1155   OT Stop Time 1235   OT Time Calculation (min) 40 min   Activity Tolerance Patient tolerated treatment well  with no increase in pain      Past Medical History  Diagnosis Date  . Anemia   . Medical history non-contributory     Past Surgical History  Procedure Laterality Date  . Wisdom tooth extraction    . No past surgeries      There were no vitals taken for this visit.  Visit Diagnosis:  Pain in joint, forearm, unspecified laterality  Stiffness of joint, hand, unspecified laterality          OT Treatments/Exercises (OP) - 06/06/14 0001    Neurological Re-education Exercises   Other Exercises 1 Performed wrist flex, ext, rad. & ulnar dev seperate from thumb flex/radial abduction x 10 reps each   Other Exercises 2 Pt shown putty ex's (red) for mass grasp and tip pinch bilaterally. Pt performed each x 10 reps but instructed to keep wrist neutral.    Modalities   Modalities Fluidotherapy   LUE Fluidotherapy   Number Minutes Fluidotherapy 10 Minutes   LUE Fluidotherapy Location Hand;Wrist   Comments to decrease pain            OT Short Term Goals - 06/06/14 1242    OT SHORT TERM GOAL #1   Title Independent w/ splint wear and care and activity modification to minimize pain   Baseline due 06/11/14   Time 1   Period Weeks   Status On-going   OT SHORT TERM GOAL #2   Title I with initial HEP   Baseline due 06/11/14   Time 1   Period Weeks   Status On-going   OT SHORT TERM GOAL #3   Title Pt will verbalize understanding of pain reduction strategies   Baseline due 06/11/14   Time 1   Period Weeks   Status On-going          OT Long Term Goals - 06/06/14 1244    OT LONG  TERM GOAL #1   Title Independent w/ updated HEP   Baseline DUE 07/10/14   Status On-going   OT LONG TERM GOAL #2   Title Pt will report UE pain less than or equal to 3/10 bilaterally w/ ADLS/IADLS   Baseline Due 32/99/24   Status On-going   OT LONG TERM GOAL #3   Title Pt will demo wrist extension WNLS for functional use    Baseline Due 07/10/14   Status On-going   OT LONG TERM GOAL #4   Title Pt will demo bilateral thumb MP flexion WFL's for ADLS/IADLS   Baseline DUE 26/83/41   Status On-going          Plan - 06/06/14 1249    Clinical Impression Statement Pt is tolerating AROM ex's and light putty ex's w/ no increase in pain today   Plan continue modalities, AROM, and strengthening as tolerated. Discuss activity modifications        Problem List Patient Active Problem List   Diagnosis Date Noted  . Active labor 12/22/2013  . Supervision of other normal pregnancy 12/22/2013  . Pregnancy 12/21/2013  [name] 06/06/2014, 1:18 PM

## 2014-06-06 NOTE — Patient Instructions (Signed)
Grip Strengthening (Resistive Putty)   Squeeze putty using thumb and all fingers. Repeat __10__ times. Do _2___ sessions per day. Both hands  2. Then roll putty into thick tube and pinch b/t each finger and thumb both hands.   Copyright  VHI. All rights reserved.

## 2014-06-08 ENCOUNTER — Encounter: Payer: Self-pay | Admitting: Occupational Therapy

## 2014-06-08 ENCOUNTER — Ambulatory Visit: Payer: 59 | Admitting: Occupational Therapy

## 2014-06-08 DIAGNOSIS — M25649 Stiffness of unspecified hand, not elsewhere classified: Secondary | ICD-10-CM

## 2014-06-08 DIAGNOSIS — M25532 Pain in left wrist: Secondary | ICD-10-CM | POA: Diagnosis not present

## 2014-06-08 DIAGNOSIS — M25539 Pain in unspecified wrist: Secondary | ICD-10-CM

## 2014-06-08 NOTE — Therapy (Signed)
Occupational Therapy Treatment  Patient Details  Name: Lauren Guerrero MRN: [id number] Date of Birth: Nov 16, 1975  Encounter Date: 06/08/2014      OT End of Session - 06/08/14 1632    Visit Number 5   Number of Visits 16   Date for OT Re-Evaluation 07/10/14  However plans on d/c 06/19/14   Authorization Type UMR   OT Start Time 8657   OT Stop Time 1615   OT Time Calculation (min) 43 min      Past Medical History  Diagnosis Date  . Anemia   . Medical history non-contributory     Past Surgical History  Procedure Laterality Date  . Wisdom tooth extraction    . No past surgeries      There were no vitals taken for this visit.  Visit Diagnosis:  Pain in joint, forearm, unspecified laterality  Stiffness of joint, hand, unspecified laterality      Subjective Assessment - 06/08/14 1551    Symptoms "the putty is doing well" no increase in pain   Currently in Pain? Yes   Pain Score 5    Pain Location Wrist   Pain Orientation Left;Right   Pain Type Chronic pain   Pain Onset More than a month ago   Pain Frequency Constant   Aggravating Factors  twisting motions, lifting   Pain Relieving Factors cold packs, heat, ultrasound            OT Treatments/Exercises (OP) - 06/08/14 1623    ADLs   ADL Education Given Yes   Adaptive Utensils Suggested ergonomic handled knives, vegetable peelers, electric can openers and A/E for jar openers to help reduce pain. Pt provided handouts and recommended where/how to purchase   General Comments Pt shown how to adapt tasks or adapt how to hold objects for cooking tasks/childcare to reduce pain. Also discussed joint protection techniques and other compensatory strategies   Modalities   Modalities Fluidotherapy   LUE Fluidotherapy   Number Minutes Fluidotherapy 12 Minutes   LUE Fluidotherapy Location Hand;Wrist  bilaterally   Comments to decrease pain                Plan - 06/08/14 1636    Clinical Impression Statement  Pt progressing towards all STG's   Plan check STG's next visit, ROM measurments, wrist strengthening w/ light weight if tolerated        Problem List Patient Active Problem List   Diagnosis Date Noted  . Active labor 12/22/2013  . Supervision of other normal pregnancy 12/22/2013  . Pregnancy 12/21/2013   Redmond Baseman, OTR/L [place], [place] [address] Phone: [phone] Fax: [phone]                                             [name] 06/08/2014, 4:40 PM

## 2014-06-14 ENCOUNTER — Ambulatory Visit: Payer: 59 | Admitting: Occupational Therapy

## 2014-06-14 DIAGNOSIS — M25649 Stiffness of unspecified hand, not elsewhere classified: Secondary | ICD-10-CM

## 2014-06-14 DIAGNOSIS — M25532 Pain in left wrist: Secondary | ICD-10-CM | POA: Diagnosis not present

## 2014-06-14 DIAGNOSIS — M25539 Pain in unspecified wrist: Secondary | ICD-10-CM

## 2014-06-14 NOTE — Patient Instructions (Addendum)
Hold a 1 lb weight or small water bottle, bend your wrist up and down ( with palm down, then palm up for each arm). 10-15 reps each arm, each direction 1-2 x day Perform only within pain free range. Stop if increased pain

## 2014-06-14 NOTE — Therapy (Signed)
Occupational Therapy Treatment  Patient Details  Name: Lauren Guerrero MRN: [id number] Date of Birth: Dec 12, 1975  Encounter Date: 06/14/2014      OT End of Session - 06/14/14 1629    Visit Number 7   Number of Visits 16   Date for OT Re-Evaluation 07/10/14   Authorization Type UMR   OT Start Time 1537   OT Stop Time 1615   OT Time Calculation (min) 38 min   Activity Tolerance Patient tolerated treatment well   Behavior During Therapy [facility] for tasks assessed/performed      Past Medical History  Diagnosis Date  . Anemia   . Medical history non-contributory     Past Surgical History  Procedure Laterality Date  . Wisdom tooth extraction    . No past surgeries      There were no vitals taken for this visit.  Visit Diagnosis:  No diagnosis found.      Subjective Assessment - 06/14/14 1550    Currently in Pain? Yes   Pain Score 4    Pain Location Wrist   Pain Orientation Right;Left   Pain Type Chronic pain   Pain Onset More than a month ago   Pain Frequency Constant   Aggravating Factors  twisting, lifting   Pain Relieving Factors cold, heat ultrasound.              OT Education - 06/14/14 1628    Education provided Yes   Education Details gentle wrist strengthening with 1 lb weight   Person(s) Educated Patient   Methods Explanation;Demonstration   Comprehension Verbalized understanding;Returned demonstration          OT Short Term Goals - 06/14/14 1605    OT SHORT TERM GOAL #1   Status Achieved   OT SHORT TERM GOAL #2   Title I with initial HEP   Status Achieved   OT SHORT TERM GOAL #3   Title Pt will verbalize understanding of pain reduction strategies   Baseline due 06/11/14   Status Achieved            Plan - 06/14/14 1630    Clinical Impression Statement Fluidotherapy to bilateral UE's x 10 mins for pain relief, no adverse reactions. Ultrasound 3 MHz, 0.8 w/cm2, 20% x 8 mins to left radial wrist(as pt reports it bothers her more than  right.) Pt was instructed in gentle strengthening, with 1 lb weight for bilateral wrists, 2 setsof 10 reps. Therapist discussed s ways to modifiy dishwashing to minimize wrist pain., and checked progress towards STG's. 3/3 goals met.   Rehab Potential Good   Clinical Impairments Affecting Rehab Potential pain   OT Frequency 2x / week   OT Duration 8 weeks   OT Treatment/Interventions Moist Heat;Fluidtherapy;DME and/or AE instruction;Splinting;Patient/family education;Contrast Bath;Ultrasound;Therapeutic exercise;Therapeutic activities;Cryotherapy;Parrafin;Energy conservation;Manual Therapy   Plan ROM measurements, progress HEP as tolerated, anticipate d/c next week.   OT Home Exercise Plan wrist strengthening   Consulted and Agree with Plan of Care Patient        Problem List Patient Active Problem List   Diagnosis Date Noted  . Active labor 12/22/2013  . Supervision of other normal pregnancy 12/22/2013  . Pregnancy 12/21/2013                                            Theone Murdoch, OTR/L  RINE,KATHRYN 06/14/2014, 4:40 PM

## 2014-06-16 ENCOUNTER — Ambulatory Visit: Payer: 59 | Admitting: Occupational Therapy

## 2014-06-16 DIAGNOSIS — M25649 Stiffness of unspecified hand, not elsewhere classified: Secondary | ICD-10-CM

## 2014-06-16 DIAGNOSIS — M25532 Pain in left wrist: Secondary | ICD-10-CM | POA: Diagnosis not present

## 2014-06-16 DIAGNOSIS — M25539 Pain in unspecified wrist: Secondary | ICD-10-CM

## 2014-06-16 NOTE — Therapy (Signed)
Occupational Therapy Treatment  Patient Details  Name: Lauren Guerrero MRN: [id number] Date of Birth: Oct 09, 1975  Encounter Date: 06/16/2014      OT End of Session - 06/16/14 1435    Visit Number 8   Number of Visits 16   Date for OT Re-Evaluation 07/10/14   Authorization Type UMR   OT Start Time 1408   OT Stop Time 1445   OT Time Calculation (min) 37 min   Activity Tolerance Patient tolerated treatment well   Behavior During Therapy [facility] for tasks assessed/performed      Past Medical History  Diagnosis Date  . Anemia   . Medical history non-contributory     Past Surgical History  Procedure Laterality Date  . Wisdom tooth extraction    . No past surgeries      There were no vitals taken for this visit.  Visit Diagnosis:  No diagnosis found.      Subjective Assessment - 06/16/14 1436    Currently in Pain? Yes   Pain Score 3    Pain Location Wrist   Pain Orientation Left   Pain Type Chronic pain   Pain Onset More than a month ago   Pain Frequency Constant   Aggravating Factors  twisting, lifting   Effect of Pain on Daily Activities cold, heat, ultrasound   Multiple Pain Sites Yes   Pain Score 2   Pain Type Chronic pain   Pain Location Wrist   Pain Orientation Right   Pain Descriptors / Indicators Aching   Pain Frequency Constant            OT Treatments/Exercises (OP) - 06/16/14 0001    LUE Fluidotherapy   Number Minutes Fluidotherapy 10 Minutes   LUE Fluidotherapy Location Hand;Wrist   Comments strenght: RUE 79 lbs, LUE 63lbs          OT Education - 06/16/14 1508    Education provided Yes   Education Details reveiwed wrist strengthening   Person(s) Educated Patient   Methods Explanation   Comprehension Verbalized understanding;Returned demonstration            OT Long Term Goals - 06/16/14 1510    OT LONG TERM GOAL #1   Title Independent w/ updated HEP   Baseline DUE 07/10/14   Time 1   Period Weeks   Status On-going   OT LONG  TERM GOAL #2   Title Pt will report UE pain less than or equal to 3/10 bilaterally w/ ADLS/IADLS   Baseline Due 32/67/12   Time 1   Period Weeks   Status On-going   OT LONG TERM GOAL #3   Title Pt will demo wrist extension WNLS for functional use    Baseline current :wrist extension bilaterally:60   Status Achieved   OT LONG TERM GOAL #4   Title Pt will demo bilateral thumb MP flexion WFL's for ADLS/IADLS   Baseline DUE 45/80/99   Time 1   Period Weeks   Status On-going          Plan - 06/16/14 1517    Clinical Impression Statement Fluidotherapy to bilateral UE's x 10 mins no adverse reactions. Ultrasound 3MHz,0.8wcm2, 20% x 8 mins to left radial wrist, no adverse reactions. Reviewed strengthening HEP with 1 lb weight, reviewed theraputty HEP with biateral UE's. Discussed activity modification for home mangement and child care, foam grip provided for hand writing.   Rehab Potential Good   Clinical Impairments Affecting Rehab Potential pain   OT Frequency 2x /  week   OT Duration 8 weeks   OT Treatment/Interventions Moist Heat;Fluidtherapy;DME and/or AE instruction;Splinting;Patient/family education;Contrast Bath;Ultrasound;Therapeutic exercise;Therapeutic activities;Cryotherapy;Parrafin;Energy conservation;Manual Therapy   Plan Check goals, anticipate d/c   OT Home Exercise Plan wrist strengthening   Consulted and Agree with Plan of Care Patient        Problem List Patient Active Problem List   Diagnosis Date Noted  . Active labor 12/22/2013  . Supervision of other normal pregnancy 12/22/2013  . Pregnancy 12/21/2013    A/ROM: wrist flexion/ extension RUE: 86/60, LUE: 85/60.                                         Theone Murdoch, OTR/L [phone] Phone: [phone] 3:29 PM 06/16/2014  RINE,KATHRYN 06/16/2014, 3:29 PM

## 2014-06-19 ENCOUNTER — Ambulatory Visit: Payer: 59 | Admitting: Occupational Therapy

## 2014-06-19 DIAGNOSIS — M25649 Stiffness of unspecified hand, not elsewhere classified: Secondary | ICD-10-CM

## 2014-06-19 DIAGNOSIS — M25532 Pain in left wrist: Secondary | ICD-10-CM | POA: Diagnosis not present

## 2014-06-19 DIAGNOSIS — M25539 Pain in unspecified wrist: Secondary | ICD-10-CM

## 2014-06-19 NOTE — Therapy (Signed)
Occupational Therapy Treatment  Patient Details  Name: Lauren Guerrero MRN: [id number] Date of Birth: Mar 07, 1976  Encounter Date: 06/19/2014      OT End of Session - 06/19/14 1450    Visit Number 9   Number of Visits 16   Authorization Type UMR   OT Start Time 1406   OT Stop Time 1440   OT Time Calculation (min) 34 min   Activity Tolerance Patient tolerated treatment well   Behavior During Therapy [facility] for tasks assessed/performed      Past Medical History  Diagnosis Date  . Anemia   . Medical history non-contributory     Past Surgical History  Procedure Laterality Date  . Wisdom tooth extraction    . No past surgeries      There were no vitals taken for this visit.  Visit Diagnosis:  Pain in joint, forearm, unspecified laterality  Stiffness of joint, hand, unspecified laterality      Subjective Assessment - 06/19/14 1409    Currently in Pain? Yes   Pain Score 3    Pain Orientation Left   Pain Descriptors / Indicators Aching   Pain Type Chronic pain   Pain Onset More than a month ago   Pain Frequency Constant   Aggravating Factors  twisting, lifting   Pain Relieving Factors cold, heat ultrasound   Multiple Pain Sites Yes   Pain Score 2   Pain Type Chronic pain   Pain Location Wrist   Pain Orientation Right   Pain Descriptors / Indicators Aching                OT Short Term Goals - 06/19/14 1452    OT SHORT TERM GOAL #1   Title Independent w/ splint wear and care and activity modification to minimize pain   Status Achieved   OT SHORT TERM GOAL #2   Title I with initial HEP   Status Achieved   OT SHORT TERM GOAL #3   Title Pt will verbalize understanding of pain reduction strategies   Status Achieved          OT Long Term Goals - 06/19/14 1412    OT LONG TERM GOAL #1   Title Independent w/ updated HEP   Status Achieved   OT LONG TERM GOAL #2   Title Pt will report UE pain less than or equal to 3/10 bilaterally w/ ADLS/IADLS   Status  Achieved   OT LONG TERM GOAL #3   Title Pt will demo wrist extension WNLS for functional use    Baseline current :wrist extension bilaterally:60   Status Achieved   OT LONG TERM GOAL #4   Title Pt will demo bilateral thumb MP flexion WFL's for ADLS/IADLS   Status Achieved          Plan - 06/19/14 1452    Clinical Impression Statement Fluidotherapy x 31mns to bilateral UE's, no adverse reactions, checked progress towards long term goals. Reviewed wrist strengthening exercises with 2 lbs weight 10-20 reps each for wrist flexion/ extension bilaterally. Ultrasound 3 MHz, 0.8 wcm2, 20% x 8 mins to left radial wrist no adverse reactions. Reinforced use of wrist braces, rest, and ice/ heat if her pain increases again. Pt agrees with d/c.   Rehab Potential Good   Clinical Impairments Affecting Rehab Potential pain   OT Frequency 2x / week   OT Duration 8 weeks   OT Treatment/Interventions Moist Heat;Fluidtherapy;DME and/or AE instruction;Splinting;Patient/family education;Contrast Bath;Ultrasound;Therapeutic exercise;Therapeutic activities;Cryotherapy;Parrafin;Energy conservation;Manual Therapy   Plan d/c  OT   OT Home Exercise Plan wrist strengthening   Recommended Other Services Pt agrees with d/c   Consulted and Agree with Plan of Care Patient        Problem List Patient Active Problem List   Diagnosis Date Noted  . Active labor 12/22/2013  . Supervision of other normal pregnancy 12/22/2013  . Pregnancy 12/21/2013               OCCUPATIONAL THERAPY DISCHARGE SUMMARY  Remaining deficits: Pt presents with mild bilateral wrist pain however significantly improved since evaluation.   Education / Equipment: Pt was educated regarding the following: HEP, splint wear, care and precautions, activity modification to minimize pain. Pt verbalizes understanding of all education.   Plan: Patient agrees to discharge.  Patient goals were not met. Patient is being discharged due to  meeting the stated rehab goals.  ?????                                   Theone Murdoch, OTR/L [phone] Phone: [phone] 3:01 PM 06/19/2014  RINE,KATHRYN 06/19/2014, 3:01 PM

## 2015-07-29 LAB — HM PAP SMEAR

## 2016-06-12 DIAGNOSIS — Z6827 Body mass index (BMI) 27.0-27.9, adult: Secondary | ICD-10-CM | POA: Diagnosis not present

## 2016-06-12 DIAGNOSIS — N970 Female infertility associated with anovulation: Secondary | ICD-10-CM | POA: Diagnosis not present

## 2016-06-12 DIAGNOSIS — Z01419 Encounter for gynecological examination (general) (routine) without abnormal findings: Secondary | ICD-10-CM | POA: Diagnosis not present

## 2016-08-26 DIAGNOSIS — N840 Polyp of corpus uteri: Secondary | ICD-10-CM | POA: Diagnosis not present

## 2016-08-26 DIAGNOSIS — Z319 Encounter for procreative management, unspecified: Secondary | ICD-10-CM | POA: Diagnosis not present

## 2016-09-19 DIAGNOSIS — Z319 Encounter for procreative management, unspecified: Secondary | ICD-10-CM | POA: Diagnosis not present

## 2016-09-19 DIAGNOSIS — N840 Polyp of corpus uteri: Secondary | ICD-10-CM | POA: Diagnosis not present

## 2016-12-11 DIAGNOSIS — N840 Polyp of corpus uteri: Secondary | ICD-10-CM | POA: Diagnosis not present

## 2016-12-13 DIAGNOSIS — Z319 Encounter for procreative management, unspecified: Secondary | ICD-10-CM | POA: Diagnosis not present

## 2017-01-06 DIAGNOSIS — Z319 Encounter for procreative management, unspecified: Secondary | ICD-10-CM | POA: Diagnosis not present

## 2017-01-09 DIAGNOSIS — Z3189 Encounter for other procreative management: Secondary | ICD-10-CM | POA: Diagnosis not present

## 2017-01-23 MED FILL — LETROZOLE 2.5 MG TABLET: 2.5 | 5 days supply | Qty: 15 | Fill #0

## 2017-02-02 DIAGNOSIS — Z319 Encounter for procreative management, unspecified: Secondary | ICD-10-CM | POA: Diagnosis not present

## 2017-02-19 MED FILL — LETROZOLE 2.5 MG TABLET: 2.5 | 5 days supply | Qty: 15 | Fill #1

## 2017-02-27 DIAGNOSIS — Z319 Encounter for procreative management, unspecified: Secondary | ICD-10-CM | POA: Diagnosis not present

## 2017-03-03 DIAGNOSIS — Z3189 Encounter for other procreative management: Secondary | ICD-10-CM | POA: Diagnosis not present

## 2017-04-09 MED FILL — LETROZOLE 2.5 MG TABLET: 2.5 | 5 days supply | Qty: 15 | Fill #0

## 2017-04-20 DIAGNOSIS — Z3183 Encounter for assisted reproductive fertility procedure cycle: Secondary | ICD-10-CM | POA: Diagnosis not present

## 2017-04-23 DIAGNOSIS — Z3189 Encounter for other procreative management: Secondary | ICD-10-CM | POA: Diagnosis not present

## 2017-06-05 DIAGNOSIS — N925 Other specified irregular menstruation: Secondary | ICD-10-CM | POA: Diagnosis not present

## 2017-06-08 DIAGNOSIS — O021 Missed abortion: Secondary | ICD-10-CM | POA: Diagnosis not present

## 2017-06-09 MED FILL — ACETAMINOPHEN/COD #3 TABLET: 300-30 | 2 days supply | Qty: 10 | Fill #0

## 2017-06-09 MED FILL — miSOPROStol 200 MCG TABS: 200 | 1 days supply | Qty: 4 | Fill #0

## 2017-06-29 DIAGNOSIS — N939 Abnormal uterine and vaginal bleeding, unspecified: Secondary | ICD-10-CM | POA: Diagnosis not present

## 2017-07-02 MED FILL — NORG-ETHIN ESTRA 0.25-0.035: 0.25-35 | 84 days supply | Qty: 84 | Fill #0

## 2017-08-05 DIAGNOSIS — E288 Other ovarian dysfunction: Secondary | ICD-10-CM | POA: Diagnosis not present

## 2017-08-05 DIAGNOSIS — Z319 Encounter for procreative management, unspecified: Secondary | ICD-10-CM | POA: Diagnosis not present

## 2017-08-14 ENCOUNTER — Ambulatory Visit: Payer: Self-pay | Admitting: Family Medicine

## 2017-08-28 ENCOUNTER — Ambulatory Visit: Payer: Self-pay | Admitting: Family Medicine

## 2017-09-18 DIAGNOSIS — Z3141 Encounter for fertility testing: Secondary | ICD-10-CM | POA: Diagnosis not present

## 2017-09-18 MED FILL — GONAL-F 1,050 UNITS VIAL: 1050 | 7 days supply | Qty: 2 | Fill #0

## 2017-09-18 MED FILL — ESTRADIOL 2 MG TABS: 2 | 30 days supply | Qty: 60 | Fill #0

## 2017-09-18 MED FILL — CETROTIDE 0.25 MG KIT: 0.25 | 4 days supply | Qty: 4 | Fill #0

## 2017-09-18 MED FILL — ESTRADIOL 0.1 MG PATCH: 0.1 | 28 days supply | Qty: 8 | Fill #0

## 2017-09-18 MED FILL — GONAL-F 450 UNIT SOLR: 450 | 1 days supply | Qty: 1 | Fill #0

## 2017-09-18 MED FILL — METHYLPREDNISOLONE 4 MG TAB: 4 | 4 days supply | Qty: 16 | Fill #0

## 2017-09-18 MED FILL — DOXYCYCLINE HYCLATE 100 MG: 100 | 5 days supply | Qty: 10 | Fill #0

## 2017-09-18 MED FILL — MENOPUR 75 UNIT VIAL: 75 | 10 days supply | Qty: 10 | Fill #0

## 2017-09-18 MED FILL — PROGESTERONE OIL 50 MG/ML V: 50 | 30 days supply | Qty: 30 | Fill #0

## 2017-09-28 MED FILL — PREGNYL 10,000 UNITS VIAL: 10000 | 1 days supply | Qty: 1 | Fill #0

## 2017-09-30 MED FILL — DOXYCYCLINE HYCLATE 100 MG: 100 | 20 days supply | Qty: 40 | Fill #0

## 2017-10-05 ENCOUNTER — Encounter: Payer: Self-pay | Admitting: Family Medicine

## 2017-10-05 ENCOUNTER — Other Ambulatory Visit: Payer: Self-pay

## 2017-10-05 ENCOUNTER — Ambulatory Visit (INDEPENDENT_AMBULATORY_CARE_PROVIDER_SITE_OTHER): Payer: No Typology Code available for payment source | Admitting: Family Medicine

## 2017-10-05 VITALS — BP 106/72 | HR 76 | Temp 98.1°F | Resp 16 | Ht 66.0 in | Wt 173.0 lb

## 2017-10-05 DIAGNOSIS — R239 Unspecified skin changes: Secondary | ICD-10-CM

## 2017-10-05 DIAGNOSIS — E042 Nontoxic multinodular goiter: Secondary | ICD-10-CM | POA: Insufficient documentation

## 2017-10-05 DIAGNOSIS — Z91010 Allergy to peanuts: Secondary | ICD-10-CM | POA: Diagnosis not present

## 2017-10-05 DIAGNOSIS — Z0000 Encounter for general adult medical examination without abnormal findings: Secondary | ICD-10-CM

## 2017-10-05 LAB — CBC WITH DIFFERENTIAL/PLATELET
Basophils Absolute: 0 10*3/uL (ref 0.0–0.1)
Basophils Relative: 0.7 % (ref 0.0–3.0)
Eosinophils Absolute: 0 10*3/uL (ref 0.0–0.7)
Eosinophils Relative: 1 % (ref 0.0–5.0)
HEMATOCRIT: 36.1 % (ref 36.0–46.0)
Hemoglobin: 12.3 g/dL (ref 12.0–15.0)
LYMPHS PCT: 38 % (ref 12.0–46.0)
Lymphs Abs: 1.3 10*3/uL (ref 0.7–4.0)
MCHC: 34.1 g/dL (ref 30.0–36.0)
MCV: 86.8 fl (ref 78.0–100.0)
MONOS PCT: 5.6 % (ref 3.0–12.0)
Monocytes Absolute: 0.2 10*3/uL (ref 0.1–1.0)
Neutro Abs: 1.9 10*3/uL (ref 1.4–7.7)
Neutrophils Relative %: 54.7 % (ref 43.0–77.0)
Platelets: 265 10*3/uL (ref 150.0–400.0)
RBC: 4.16 Mil/uL (ref 3.87–5.11)
RDW: 13 % (ref 11.5–15.5)
WBC: 3.5 10*3/uL — AB (ref 4.0–10.5)

## 2017-10-05 LAB — HEPATIC FUNCTION PANEL
ALBUMIN: 4.3 g/dL (ref 3.5–5.2)
ALT: 12 U/L (ref 0–35)
AST: 15 U/L (ref 0–37)
Alkaline Phosphatase: 45 U/L (ref 39–117)
BILIRUBIN TOTAL: 0.4 mg/dL (ref 0.2–1.2)
Bilirubin, Direct: 0.1 mg/dL (ref 0.0–0.3)
Total Protein: 7 g/dL (ref 6.0–8.3)

## 2017-10-05 LAB — BASIC METABOLIC PANEL
BUN: 14 mg/dL (ref 6–23)
CO2: 27 mEq/L (ref 19–32)
CREATININE: 0.67 mg/dL (ref 0.40–1.20)
Calcium: 9.5 mg/dL (ref 8.4–10.5)
Chloride: 104 mEq/L (ref 96–112)
GFR: 124.87 mL/min (ref >60.00)
Glucose, Bld: 88 mg/dL (ref 70–99)
Potassium: 4.1 mEq/L (ref 3.5–5.1)
Sodium: 137 mEq/L (ref 135–145)

## 2017-10-05 LAB — LIPID PANEL
Cholesterol: 181 mg/dL (ref 0–200)
HDL: 68.3 mg/dL (ref >39.00)
LDL CALC: 102 mg/dL — AB (ref 0–99)
NONHDL: 112.67
TRIGLYCERIDES: 54 mg/dL (ref 0.0–149.0)
Total CHOL/HDL Ratio: 3
VLDL: 10.8 mg/dL (ref 0.0–40.0)

## 2017-10-05 MED ORDER — EPINEPHRINE 0.3 MG/0.3ML IJ SOAJ: 0.3000 mg | Freq: Once | INTRAMUSCULAR | 3 refills | Status: AC

## 2017-10-05 MED FILL — EPINEPHRINE 0.3 MG AUTO-INJ: 0.3 | 2 days supply | Qty: 2 | Fill #0

## 2017-10-05 NOTE — Patient Instructions (Signed)
Follow up in 1 year or as needed We'll notify you of your lab results and make any changes if needed Keep up the good work on healthy diet and regular exercise- you look great!!! We'll call you with your ultrasound appt for the thyroid They should call you with your dermatology appt Call with any questions or concerns Welcome!  We're glad to have you!!! GOOD LUCK!!!

## 2017-10-05 NOTE — Assessment & Plan Note (Signed)
New to provider, pt has had [place] previously which noted thyroid nodules.  Will get [place] to reassess.  Pt expressed understanding and is in agreement w/ plan.

## 2017-10-05 NOTE — Progress Notes (Signed)
   Subjective:    Patient ID: Lauren Guerrero, female    DOB: [birth year], 42 y.o.   MRN: [id number]  HPI New to establish.  No recent PCP.  GYNPhilis Pique.  Fertility- Dr Tasia Catchings.  CPE- no concerns today.  UTD w/ GYN.     Review of Systems Patient reports no vision/ hearing changes, adenopathy,fever, weight change,  persistant/recurrent hoarseness , swallowing issues, chest pain, palpitations, edema, persistant/recurrent cough, hemoptysis, dyspnea (rest/exertional/paroxysmal nocturnal), gastrointestinal bleeding (melena, rectal bleeding), abdominal pain, significant heartburn, bowel changes, GU symptoms (dysuria, hematuria, incontinence), Gyn symptoms (abnormal  bleeding, pain),  syncope, focal weakness, memory loss, numbness & tingling, hair/nail changes, abnormal bruising or bleeding, anxiety, or depression.     L shoulder skin changes- come and go, some itching.  Asking for derm referral.  Objective:   Physical Exam General Appearance:    Alert, cooperative, no distress, appears stated age  Head:    Normocephalic, without obvious abnormality, atraumatic  Eyes:    PERRL, conjunctiva/corneas clear, EOM's intact, fundi    benign, both eyes  Ears:    Normal TM's and external ear canals, both ears  Nose:   Nares normal, septum midline, mucosa normal, no drainage    or sinus tenderness  Throat:   Lips, mucosa, and tongue normal; teeth and gums normal  Neck:   Supple, symmetrical, trachea midline, no adenopathy;    Thyroid: mild fullness  Back:     Symmetric, no curvature, ROM normal, no CVA tenderness  Lungs:     Clear to auscultation bilaterally, respirations unlabored  Chest Wall:    No tenderness or deformity   Heart:    Regular rate and rhythm, S1 and S2 normal, no murmur, rub   or gallop  Breast Exam:    Deferred to GYN  Abdomen:     Soft, non-tender, bowel sounds active all four quadrants,    no masses, no organomegaly  Genitalia:    Deferred to GYN  Rectal:      Extremities:   Extremities normal, atraumatic, no cyanosis or edema  Pulses:   2+ and symmetric all extremities  Skin:   Skin color, texture, turgor normal, no rashes or lesions.  Pt has Mongolian spot under L eye and hyperpigmentation of L shoulder  Lymph nodes:   Cervical, supraclavicular, and axillary nodes normal  Neurologic:   CNII-XII intact, normal strength, sensation and reflexes    throughout          Assessment & Plan:

## 2017-10-05 NOTE — Assessment & Plan Note (Signed)
Pt's PE WNL w/ exception of skin changes on L shoulder.  UTD on GYN.  Refer to derm at pt's request.  UTD on Tdap.  Check labs today.  Anticipatory guidance provided.

## 2017-10-05 NOTE — Assessment & Plan Note (Signed)
Epipen provided

## 2017-10-06 LAB — VITAMIN D 25 HYDROXY (VIT D DEFICIENCY, FRACTURES): VITD: 23.4 ng/mL — AB (ref 30.00–100.00)

## 2017-10-06 LAB — TSH: TSH: 1.09 u[IU]/mL (ref 0.35–4.50)

## 2017-10-07 ENCOUNTER — Other Ambulatory Visit: Payer: Self-pay | Admitting: General Practice

## 2017-10-07 MED ORDER — VITAMIN D (ERGOCALCIFEROL) 1.25 MG (50000 UNIT) PO CAPS: 50000.0000 [IU] | ORAL_CAPSULE | ORAL | 0 refills | Status: DC

## 2017-10-07 MED FILL — VIT D2 1.25 MG (50,000 UNIT: 1.25 MG | 84 days supply | Qty: 12 | Fill #0

## 2017-10-08 MED FILL — GONAL-F 1,050 UNITS VIAL: 1050 | 3 days supply | Qty: 1 | Fill #0

## 2017-10-12 MED FILL — CETROTIDE 0.25 MG KIT: 0.25 | 4 days supply | Qty: 4 | Fill #1

## 2017-10-13 MED FILL — PROMETHAZINE 12.5 MG TABLET: 12.5 | 2 days supply | Qty: 10 | Fill #0

## 2017-10-13 MED FILL — OXYCOD/ACETAMINOPHEN 5-325M: 5-325 | 2 days supply | Qty: 10 | Fill #0

## 2017-12-12 MED FILL — ESTRADIOL 0.1 MG PATCH: 0.1 | 28 days supply | Qty: 8 | Fill #1

## 2017-12-18 MED FILL — METHYLPREDNISOLONE 4 MG TAB: 4 | 1 days supply | Qty: 4 | Fill #0

## 2017-12-25 MED FILL — ESTRADIOL 0.1 MG PATCH: 0.1 | 28 days supply | Qty: 8 | Fill #2 | Status: TO

## 2017-12-25 MED FILL — ESTRADIOL 2 MG TABS: 2 | 30 days supply | Qty: 60 | Fill #1

## 2017-12-31 MED FILL — PROGESTERONE OIL 50 MG/ML V: 50 | 30 days supply | Qty: 30 | Fill #1

## 2018-01-15 MED FILL — ESTRADIOL 0.1 MG PATCH: 0.1 | 28 days supply | Qty: 8 | Fill #0

## 2018-02-04 MED FILL — PROGESTERONE MICRONIZED 200: 200 | 14 days supply | Qty: 14 | Fill #0

## 2018-02-11 LAB — HM PAP SMEAR

## 2018-02-15 ENCOUNTER — Encounter: Payer: Self-pay | Admitting: General Practice

## 2018-02-26 LAB — OB RESULTS CONSOLE RPR: RPR: NONREACTIVE

## 2018-02-26 LAB — OB RESULTS CONSOLE ABO/RH: RH Type: POSITIVE

## 2018-02-26 LAB — OB RESULTS CONSOLE GC/CHLAMYDIA
Chlamydia: NEGATIVE
Gonorrhea: NEGATIVE

## 2018-02-26 LAB — OB RESULTS CONSOLE HEPATITIS B SURFACE ANTIGEN: HEP B S AG: NEGATIVE

## 2018-02-26 LAB — OB RESULTS CONSOLE ANTIBODY SCREEN: Antibody Screen: NEGATIVE

## 2018-02-26 LAB — OB RESULTS CONSOLE HIV ANTIBODY (ROUTINE TESTING): HIV: NONREACTIVE

## 2018-02-26 LAB — OB RESULTS CONSOLE RUBELLA ANTIBODY, IGM: Rubella: IMMUNE

## 2018-05-04 ENCOUNTER — Encounter: Payer: Self-pay | Admitting: Family Medicine

## 2018-05-04 ENCOUNTER — Other Ambulatory Visit: Payer: Self-pay

## 2018-05-04 ENCOUNTER — Ambulatory Visit (INDEPENDENT_AMBULATORY_CARE_PROVIDER_SITE_OTHER): Payer: No Typology Code available for payment source | Admitting: Family Medicine

## 2018-05-04 VITALS — BP 110/62 | HR 93 | Temp 98.7°F | Resp 16 | Ht 66.0 in | Wt 188.1 lb

## 2018-05-04 DIAGNOSIS — J9801 Acute bronchospasm: Secondary | ICD-10-CM

## 2018-05-04 MED ORDER — ALBUTEROL SULFATE HFA 108 (90 BASE) MCG/ACT IN AERS
2.0000 | INHALATION_SPRAY | Freq: Four times a day (QID) | RESPIRATORY_TRACT | 2 refills | Status: DC | PRN
Start: 2018-05-04 — End: 2018-08-31

## 2018-05-04 NOTE — Patient Instructions (Signed)
Follow up as needed or as scheduled Use the Albuterol inhaler- 2 puffs as needed for spasmodic/episodic cough Tea with honey to help control coughing Drink plenty of fluids REST! Call with any questions or concerns Hang in there! CONGRATS ON THE PREGNANCY!!!

## 2018-05-04 NOTE — Progress Notes (Signed)
   Subjective:    Patient ID: Lauren Guerrero, female    DOB: Nov 30, 1975, 42 y.o.   MRN: [id number]  HPI Cough- first developed sxs in early August and this lasted for 3-4 weeks.  Then developed a 2nd cold in September that lasted for a few weeks.  Was feeling better until last week when she again began coughing.  No fevers.  No current sinus pain/pressure.  No ear pain.  + SOB but pt is also pregnant.  Cough is currently dry and will come in spells.  + sick contacts- daughter is in preschool.   Review of Systems For ROS see HPI     Objective:   Physical Exam  Constitutional: She appears well-developed and well-nourished. No distress.  HENT:  Head: Normocephalic and atraumatic.  Right Ear: Tympanic membrane normal.  Left Ear: Tympanic membrane normal.  Nose: Mucosal edema and rhinorrhea present. Right sinus exhibits no maxillary sinus tenderness and no frontal sinus tenderness. Left sinus exhibits no maxillary sinus tenderness and no frontal sinus tenderness.  Mouth/Throat: Mucous membranes are normal. Posterior oropharyngeal erythema (w/ PND) present.  Eyes: Pupils are equal, round, and reactive to light. Conjunctivae and EOM are normal.  Neck: Normal range of motion. Neck supple.  Cardiovascular: Normal rate, regular rhythm and normal heart sounds.  Pulmonary/Chest: Effort normal and breath sounds normal. No respiratory distress. She has no wheezes. She has no rales.  Intermittent dry cough  Lymphadenopathy:    She has no cervical adenopathy.  Vitals reviewed.         Assessment & Plan:  Cough- new.  This is most likely due to bronchospasm due to recent viral URIs.  No need for abx.  Start albuterol PRN for spasmodic coughing spells, tea w/ honey for cough relief, increased fluids and rest.  Add daily Claritin or Zyrtec to decrease PND.  Reviewed supportive care and red flags that should prompt return.  Pt expressed understanding and is in agreement w/ plan.

## 2018-05-12 MED FILL — AZITHROMYCIN 250 MG TABLET: 250 | 5 days supply | Qty: 6 | Fill #0

## 2018-08-10 LAB — OB RESULTS CONSOLE GBS: GBS: POSITIVE

## 2018-08-17 ENCOUNTER — Other Ambulatory Visit: Payer: Self-pay | Admitting: Obstetrics and Gynecology

## 2018-08-20 ENCOUNTER — Telehealth (HOSPITAL_COMMUNITY): Payer: Self-pay | Admitting: *Deleted

## 2018-08-20 ENCOUNTER — Encounter (HOSPITAL_COMMUNITY): Payer: Self-pay | Admitting: *Deleted

## 2018-08-20 NOTE — Telephone Encounter (Signed)
Preadmission screen  

## 2018-08-25 ENCOUNTER — Other Ambulatory Visit: Payer: Self-pay | Admitting: Obstetrics and Gynecology

## 2018-08-25 NOTE — Progress Notes (Unsigned)
43 y.o. [redacted]w[redacted]d  G2P1001 comes in for ECV with frank breech.  Fluid is 15 and placenta is posterior.  Otherwise has good fetal movement and no bleeding.  Past Medical History:  Diagnosis Date  . Anemia   . Medical history non-contributory     Past Surgical History:  Procedure Laterality Date  . NO PAST SURGERIES    . WISDOM TOOTH EXTRACTION      OB History  Gravida Para Term Preterm AB Living  2 1 1     1   SAB TAB Ectopic Multiple Live Births          1    # Outcome Date GA Lbr Len/2nd Weight Sex Delivery Anes PTL Lv  2 Current           1 Term 12/22/13 [redacted]w[redacted]d 11:24 / 00:35 3.195 kg F Vag-Spont EPI  LIV    Social History   Socioeconomic History  . Marital status: Married    Spouse name: Not on file  . Number of children: Not on file  . Years of education: Not on file  . Highest education level: Not on file  Occupational History  . Not on file  Social Needs  . Financial resource strain: Not on file  . Food insecurity:    Worry: Not on file    Inability: Not on file  . Transportation needs:    Medical: Not on file    Non-medical: Not on file  Tobacco Use  . Smoking status: Never Smoker  . Smokeless tobacco: Never Used  Substance and Sexual Activity  . Alcohol use: Yes    Comment: socially  . Drug use: No  . Sexual activity: Yes  Lifestyle  . Physical activity:    Days per week: Not on file    Minutes per session: Not on file  . Stress: Not on file  Relationships  . Social connections:    Talks on phone: Not on file    Gets together: Not on file    Attends religious service: Not on file    Active member of club or organization: Not on file    Attends meetings of clubs or organizations: Not on file    Relationship status: Not on file  . Intimate partner violence:    Fear of current or ex partner: Not on file    Emotionally abused: Not on file    Physically abused: Not on file    Forced sexual activity: Not on file  Other Topics Concern  . Not on file   Social History Narrative  . Not on file   Peanut-containing drug products; Penicillins; and Sulfa antibiotics    Prenatal Transfer Tool  Maternal Diabetes: No Genetic Screening: Normal Maternal Ultrasounds/Referrals: Normal Fetal Ultrasounds or other Referrals:  None Maternal Substance Abuse:  No Significant Maternal Medications:  None Significant Maternal Lab Results: None  Other PNC: uncomplicated.    There were no vitals filed for this visit.  Lungs/Cor:  NAD Abdomen:  soft, gravid Ex:  no cords, erythema SVE:  1/80/-1 FHTs:  140s at office, good STV, NST R; Cat 1 tracing. Toco:  q occ.   A/P   Pt here for ECV for frank breech.  All risks and benefits d/w her- pt accepts.  If successful, will proceed with induction on Sunday.  If not, will be for c/s.  GBS pos.  Lauren Guerrero

## 2018-08-26 ENCOUNTER — Encounter (HOSPITAL_COMMUNITY): Payer: Self-pay | Admitting: *Deleted

## 2018-08-26 ENCOUNTER — Other Ambulatory Visit: Payer: Self-pay | Admitting: Obstetrics and Gynecology

## 2018-08-26 ENCOUNTER — Telehealth (HOSPITAL_COMMUNITY): Payer: Self-pay | Admitting: *Deleted

## 2018-08-27 ENCOUNTER — Other Ambulatory Visit: Payer: Self-pay

## 2018-08-27 ENCOUNTER — Ambulatory Visit (HOSPITAL_BASED_OUTPATIENT_CLINIC_OR_DEPARTMENT_OTHER): Payer: No Typology Code available for payment source

## 2018-08-27 ENCOUNTER — Inpatient Hospital Stay (HOSPITAL_COMMUNITY)
Admission: RE | Admit: 2018-08-27 | Discharge: 2018-08-31 | DRG: 768 | Disposition: A | Discharge status: Home / Self Care | Payer: No Typology Code available for payment source | Attending: Obstetrics and Gynecology | Admitting: Obstetrics and Gynecology

## 2018-08-27 ENCOUNTER — Encounter (HOSPITAL_COMMUNITY): Payer: Self-pay

## 2018-08-27 VITALS — BP 112/59 | HR 88 | Temp 98.2°F | Resp 16 | Ht 65.0 in | Wt 195.0 lb

## 2018-08-27 DIAGNOSIS — O321XX0 Maternal care for breech presentation, not applicable or unspecified: Secondary | ICD-10-CM | POA: Diagnosis present

## 2018-08-27 DIAGNOSIS — Z3A38 38 weeks gestation of pregnancy: Secondary | ICD-10-CM | POA: Diagnosis not present

## 2018-08-27 DIAGNOSIS — O99824 Streptococcus B carrier state complicating childbirth: Principal | ICD-10-CM | POA: Diagnosis present

## 2018-08-27 DIAGNOSIS — O289 Unspecified abnormal findings on antenatal screening of mother: Secondary | ICD-10-CM

## 2018-08-27 DIAGNOSIS — O288 Other abnormal findings on antenatal screening of mother: Secondary | ICD-10-CM

## 2018-08-27 DIAGNOSIS — Z3A39 39 weeks gestation of pregnancy: Secondary | ICD-10-CM

## 2018-08-27 LAB — TYPE AND SCREEN
ABO/RH(D): A POS
Antibody Screen: NEGATIVE

## 2018-08-27 LAB — CBC
HCT: 34.6 % — ABNORMAL LOW (ref 36.0–46.0)
Hemoglobin: 11.6 g/dL — ABNORMAL LOW (ref 12.0–15.0)
MCH: 29.8 pg (ref 26.0–34.0)
MCHC: 33.5 g/dL (ref 30.0–36.0)
MCV: 88.9 fL (ref 80.0–100.0)
Platelets: 197 10*3/uL (ref 150–400)
RBC: 3.89 MIL/uL (ref 3.87–5.11)
RDW: 14.7 % (ref 11.5–15.5)
WBC: 7.2 10*3/uL (ref 4.0–10.5)
nRBC: 0 % (ref 0.0–0.2)

## 2018-08-27 MED ORDER — LACTATED RINGERS IV SOLN
500.0000 mL | Freq: Once | INTRAVENOUS | Status: AC
  Administered 2018-08-27: 500 mL via INTRAVENOUS

## 2018-08-27 MED ORDER — LACTATED RINGERS IV SOLN
INTRAVENOUS | Status: DC
  Administered 2018-08-27 – 2018-08-28 (×3): via INTRAVENOUS

## 2018-08-27 MED ORDER — DIPHENHYDRAMINE HCL 50 MG/ML IJ SOLN: 12.5000 mg | INTRAMUSCULAR | Status: DC | PRN

## 2018-08-27 MED ORDER — OXYTOCIN 40 UNITS IN NORMAL SALINE INFUSION - SIMPLE MED: 1.0000 m[IU]/min | INTRAVENOUS | Status: DC

## 2018-08-27 MED ORDER — PHENYLEPHRINE 40 MCG/ML (10ML) SYRINGE FOR IV PUSH (FOR BLOOD PRESSURE SUPPORT)
80.0000 ug | PREFILLED_SYRINGE | INTRAVENOUS | Status: DC | PRN
  Filled 2018-08-27 (×2): qty 10

## 2018-08-27 MED ORDER — FENTANYL 2.5 MCG/ML BUPIVACAINE 1/10 % EPIDURAL INFUSION (WH - ANES)
14.0000 mL/h | INTRAMUSCULAR | Status: DC | PRN
  Administered 2018-08-28 – 2018-08-29 (×4): 14 mL/h via EPIDURAL
  Filled 2018-08-27 (×4): qty 100

## 2018-08-27 MED ORDER — OXYTOCIN 40 UNITS IN NORMAL SALINE INFUSION - SIMPLE MED
1.0000 m[IU]/min | INTRAVENOUS | Status: DC
  Administered 2018-08-27: 4 m[IU]/min via INTRAVENOUS
  Administered 2018-08-27: 2 m[IU]/min via INTRAVENOUS
  Administered 2018-08-28: 9 m[IU]/min via INTRAVENOUS
  Filled 2018-08-27: qty 1000

## 2018-08-27 MED ORDER — ACETAMINOPHEN 325 MG PO TABS: 650.0000 mg | ORAL_TABLET | ORAL | Status: DC | PRN

## 2018-08-27 MED ORDER — OXYTOCIN BOLUS FROM INFUSION
500.0000 mL | Freq: Once | INTRAVENOUS | Status: AC
  Administered 2018-08-29: 500 mL via INTRAVENOUS

## 2018-08-27 MED ORDER — OXYTOCIN 40 UNITS IN NORMAL SALINE INFUSION - SIMPLE MED
INTRAVENOUS | Status: AC
  Filled 2018-08-27: qty 1000

## 2018-08-27 MED ORDER — LACTATED RINGERS AMNIOINFUSION
INTRAVENOUS | Status: DC
  Administered 2018-08-27 – 2018-08-28 (×2): via INTRAUTERINE
  Filled 2018-08-27: qty 1000

## 2018-08-27 MED ORDER — ONDANSETRON HCL 4 MG/2ML IJ SOLN: 4.0000 mg | Freq: Four times a day (QID) | INTRAMUSCULAR | Status: DC | PRN

## 2018-08-27 MED ORDER — LACTATED RINGERS IV SOLN
500.0000 mL | INTRAVENOUS | Status: DC | PRN
  Administered 2018-08-28 (×2): 500 mL via INTRAVENOUS

## 2018-08-27 MED ORDER — TERBUTALINE SULFATE 1 MG/ML IJ SOLN
0.2500 mg | Freq: Once | INTRAMUSCULAR | Status: AC
  Administered 2018-08-27: 0.25 mg via SUBCUTANEOUS

## 2018-08-27 MED ORDER — TERBUTALINE SULFATE 1 MG/ML IJ SOLN
INTRAMUSCULAR | Status: AC
  Filled 2018-08-27: qty 1

## 2018-08-27 MED ORDER — PHENYLEPHRINE 40 MCG/ML (10ML) SYRINGE FOR IV PUSH (FOR BLOOD PRESSURE SUPPORT)
80.0000 ug | PREFILLED_SYRINGE | INTRAVENOUS | Status: DC | PRN
  Filled 2018-08-27: qty 10

## 2018-08-27 MED ORDER — OXYTOCIN 40 UNITS IN NORMAL SALINE INFUSION - SIMPLE MED: 2.5000 [IU]/h | INTRAVENOUS | Status: DC

## 2018-08-27 MED ORDER — EPHEDRINE 5 MG/ML INJ
10.0000 mg | INTRAVENOUS | Status: DC | PRN
  Filled 2018-08-27: qty 2

## 2018-08-27 MED ORDER — TERBUTALINE SULFATE 1 MG/ML IJ SOLN
0.2500 mg | Freq: Once | INTRAMUSCULAR | Status: DC | PRN
  Filled 2018-08-27: qty 1

## 2018-08-27 MED ORDER — CLINDAMYCIN PHOSPHATE 900 MG/50ML IV SOLN
900.0000 mg | Freq: Three times a day (TID) | INTRAVENOUS | Status: DC
  Administered 2018-08-27 – 2018-08-29 (×4): 900 mg via INTRAVENOUS
  Filled 2018-08-27 (×7): qty 50

## 2018-08-27 MED ORDER — SOD CITRATE-CITRIC ACID 500-334 MG/5ML PO SOLN
30.0000 mL | ORAL | Status: DC | PRN
  Filled 2018-08-27 (×2): qty 15

## 2018-08-27 MED ORDER — LACTATED RINGERS IV SOLN: INTRAVENOUS | Status: DC

## 2018-08-27 MED ORDER — LIDOCAINE HCL (PF) 1 % IJ SOLN
30.0000 mL | INTRAMUSCULAR | Status: DC | PRN
  Filled 2018-08-27: qty 30

## 2018-08-27 NOTE — Anesthesia Preprocedure Evaluation (Signed)
Anesthesia Evaluation  Patient identified by MRN, date of birth, ID band Patient awake    Reviewed: Allergy & Precautions, H&P , NPO status , Patient's Chart, lab work & pertinent test results  Airway Mallampati: II  TM Distance: >3 FB Neck ROM: Full    Dental no notable dental hx. (+) Teeth Intact, Dental Advisory Given   Pulmonary neg pulmonary ROS,    Pulmonary exam normal breath sounds clear to auscultation       Cardiovascular Exercise Tolerance: Good negative cardio ROS Normal cardiovascular exam Rhythm:Regular Rate:Normal     Neuro/Psych negative neurological ROS  negative psych ROS   GI/Hepatic negative GI ROS,   Endo/Other    Renal/GU      Musculoskeletal   Abdominal   Peds  Hematology Hgb 11.6 Plt 197   Anesthesia Other Findings   Reproductive/Obstetrics (+) Pregnancy                             Anesthesia Physical Anesthesia Plan  ASA: II  Anesthesia Plan: Epidural   Post-op Pain Management:    Induction:   PONV Risk Score and Plan:   Airway Management Planned:   Additional Equipment:   Intra-op Plan:   Post-operative Plan:   Informed Consent: I have reviewed the patients History and Physical, chart, labs and discussed the procedure including the risks, benefits and alternatives for the proposed anesthesia with the patient or authorized representative who has indicated his/her understanding and acceptance.       Plan Discussed with:   Anesthesia Plan Comments:         Anesthesia Quick Evaluation

## 2018-08-27 NOTE — H&P (Signed)
43 y.o. [redacted]w[redacted]d  G2P1001 comes in for ECV with frank breech.  Fluid is 15 and placenta is posterior.  Otherwise has good fetal movement and no bleeding.      Past Medical History:  Diagnosis Date  . Anemia   . Medical history non-contributory          Past Surgical History:  Procedure Laterality Date  . NO PAST SURGERIES    . WISDOM TOOTH EXTRACTION                      OB History  Gravida Para Term Preterm AB Living  2 1 1     1   SAB TAB Ectopic Multiple Live Births             1       # Outcome Date GA Lbr Len/2nd Weight Sex Delivery Anes PTL Lv  2 Current           1 Term 12/22/13 [redacted]w[redacted]d 11:24 / 00:35 3.195 kg F Vag-Spont EPI  LIV    Social History        Socioeconomic History  . Marital status: Married    Spouse name: Not on file  . Number of children: Not on file  . Years of education: Not on file  . Highest education level: Not on file  Occupational History  . Not on file  Social Needs  . Financial resource strain: Not on file  . Food insecurity:    Worry: Not on file    Inability: Not on file  . Transportation needs:    Medical: Not on file    Non-medical: Not on file  Tobacco Use  . Smoking status: Never Smoker  . Smokeless tobacco: Never Used  Substance and Sexual Activity  . Alcohol use: Yes    Comment: socially  . Drug use: No  . Sexual activity: Yes  Lifestyle  . Physical activity:    Days per week: Not on file    Minutes per session: Not on file  . Stress: Not on file  Relationships  . Social connections:    Talks on phone: Not on file    Gets together: Not on file    Attends religious service: Not on file    Active member of club or organization: Not on file    Attends meetings of clubs or organizations: Not on file    Relationship status: Not on file  . Intimate partner violence:    Fear of current or ex partner: Not on file    Emotionally abused: Not on file    Physically abused: Not  on file    Forced sexual activity: Not on file  Other Topics Concern  . Not on file  Social History Narrative  . Not on file   Peanut-containing drug products; Penicillins; and Sulfa antibiotics    Prenatal Transfer Tool  Maternal Diabetes: No Genetic Screening: Normal Maternal Ultrasounds/Referrals: Normal Fetal Ultrasounds or other Referrals:  None Maternal Substance Abuse:  No Significant Maternal Medications:  None Significant Maternal Lab Results: None  Other PNC: uncomplicated.    There were no vitals filed for this visit.  Lungs/Cor:  NAD Abdomen:  soft, gravid Ex:  no cords, erythema SVE:  1/80/-1 FHTs:  140s at office, good STV, NST R; Cat 1 tracing. Toco:  q occ.   A/P   Pt here for ECV for frank breech.  All risks and benefits d/w her- pt accepts.  If successful,  will proceed with induction on Sunday.  If not, will be for c/s.           GBS pos.  Daria Pastures

## 2018-08-27 NOTE — Progress Notes (Addendum)
Pt comfortable.   Vitals:   08/27/18 1503 08/27/18 1532 08/27/18 1601 08/27/18 1633  BP: 112/63 (!) 124/52 120/81 115/65  Pulse: 88 86 84 98  Temp:       FHTs 130s, GSTV, good long term variability; not strictly reactive.  Occ very mild variables. Toco now q 3 min SVE 3/70/-2, VTX, AROM clear.  A/P  Before version, NST was reactive.  After version, FHTs had periods of good long term variability but was not strictly reactive.  BPP done, 6/10 - 2 off for fetal movements and 2 off doe NST.  Decision made to induce today.

## 2018-08-27 NOTE — Progress Notes (Signed)
After checking for NST R and [place] confirm baby breech, she was given terbutaline. Dr. Ouida Sills placed his hands on abdomen over fetal head and successfully pushed fetus to [place]. [place] confirmed now VTX and NST R. Monitor per protocol and then to home. Sunday induction- will check for vertex again.

## 2018-08-27 NOTE — Discharge Instructions (Signed)
Labor Induction    Labor induction is when steps are taken to cause a pregnant woman to begin the labor process. Most women go into labor on their own between 37 weeks and 42 weeks of pregnancy. When this does not happen or when there is a medical need for labor to begin, steps may be taken to induce labor. Labor induction causes a pregnant woman's uterus to contract. It also causes the cervix to soften (ripen), open (dilate), and thin out (efface). Usually, labor is not induced before 39 weeks of pregnancy unless there is a medical reason to do so. Your health care provider will determine if labor induction is needed.  Before inducing labor, your health care provider will consider a number of factors, including:  · Your medical condition and your baby's.  · How many weeks along you are in your pregnancy.  · How mature your baby's lungs are.  · The condition of your cervix.  · The position of your baby.  · The size of your birth canal.  What are some reasons for labor induction?  Labor may be induced if:  · Your health or your baby's health is at risk.  · Your pregnancy is overdue by 1 week or more.  · Your water breaks but labor does not start on its own.  · There is a low amount of amniotic fluid around your baby.  You may also choose (elect) to have labor induced at a certain time. Generally, elective labor induction is done no earlier than 39 weeks of pregnancy.  What methods are used for labor induction?  Methods used for labor induction include:  · Prostaglandin medicine. This medicine starts contractions and causes the cervix to dilate and ripen. It can be taken by mouth (orally) or by being inserted into the vagina (suppository).  · Inserting a small, thin tube (catheter) with a balloon into the vagina and then expanding the balloon with water to dilate the cervix.  · Stripping the membranes. In this method, your health care provider gently separates amniotic sac tissue from the cervix. This causes the  cervix to stretch, which in turn causes the release of a hormone called progesterone. The hormone causes the uterus to contract. This procedure is often done during an office visit, after which you will be sent home to wait for contractions to begin.  · Breaking the water. In this method, your health care provider uses a small instrument to make a small hole in the amniotic sac. This eventually causes the amniotic sac to break. Contractions should begin after a few hours.  · Medicine to trigger or strengthen contractions. This medicine is given through an IV that is inserted into a vein in your arm.  Except for membrane stripping, which can be done in a clinic, labor induction is done in the hospital so that you and your baby can be carefully monitored.  How long does it take for labor to be induced?  The length of time it takes to induce labor depends on how ready your body is for labor. Some inductions can take up to 2-3 days, while others may take less than a day. Induction may take longer if:  · You are induced early in your pregnancy.  · It is your first pregnancy.  · Your cervix is not ready.  What are some risks associated with labor induction?  Some risks associated with labor induction include:  · Changes in fetal heart rate, such as being too   of the placenta from the uterus (rare).  Rupture of the uterus (very rare). When induction is needed for medical reasons, the benefits of induction generally outweigh the risks. What are some reasons for not inducing labor? Labor induction should not be done if:  Your baby does not tolerate contractions.  You have had previous surgeries on your uterus, such as a myomectomy, removal of fibroids, or a vertical scar from a previous cesarean delivery.  Your placenta lies  very low in your uterus and blocks the opening of the cervix (placenta previa).  Your baby is not in a head-down position.  The umbilical cord drops down into the birth canal in front of the baby.  There are unusual circumstances, such as the baby being very early (premature).  You have had more than 2 previous cesarean deliveries. Summary  Labor induction is when steps are taken to cause a pregnant woman to begin the labor process.  Labor induction causes a pregnant woman's uterus to contract. It also causes the cervix to ripen, dilate, and efface.  Labor is not induced before 39 weeks of pregnancy unless there is a medical reason to do so.  When induction is needed for medical reasons, the benefits of induction generally outweigh the risks. This information is not intended to replace advice given to you by your health care provider. Make sure you discuss any questions you have with your health care provider. Document Released: 12/03/2006 Document Revised: 08/27/2016 Document Reviewed: 08/27/2016 Elsevier Interactive Patient Education  2019 [place] Birth A breech birth is when a baby is born with the buttocks or feet first. Most babies are in a head down (vertex) position when they are born. There are three types of breech babies:  When the babys buttocks are showing first in the vagina (birth canal) with the legs bent at the knees and the feet down near the buttocks (complete breech).  When the babys buttocks are showing first in the birth canal with the legs straight up and the feet at the babys head (frank breech).  When one or both of the babys feet are showing first in the birth canal along with the buttocks (footling breech). What are the health risks of having a breech birth? Having a breech birth increases the health risks to your baby. A breech birth may cause the following:  Umbilical cord prolapse. This is when the umbilical cord enters the birth canal  ahead of the baby, before or during labor. This can cause the cord to become pinched or compressed as labor continues. This can reduce the flow of blood and oxygen to the baby.  The baby getting stuck in the birth canal, which can cause injury or, rarely, death.  Injury to the baby's nerves in the shoulder, arm, and hand (brachial plexus injury) when delivered. What increases the risk of having a breech baby? It is not known what causes your baby to be breech. However, you are more likely to have a breech baby if:  You have had a previous pregnancy.  You are having more than one baby.  Your baby has certain birth (congenital) defects.  You have started your labor earlier than expected (premature labor).  You have problems with your uterus, such as a tumor or abnormally shaped uterus.  You have too much or not enough fluid surrounding the baby (amniotic fluid). How do I know if my baby is breech? There are no symptoms for you to know that your baby is  breech. When you are close to your due date, your health care provider can tell if your baby is breech by doing:  An abdominal or vaginal (pelvic) exam.  An ultrasound. What can be done if my baby is breech?  Your health care provider may try to turn the baby in your uterus. He or she will use a procedure called external cephalic version (ECV). He or she will place both hands on your abdomen and gently and slowly turn the baby around. It is important to know that ECV can increase your chances of suddenly going into labor. For this reason, an ECV is only done toward the end of a healthy pregnancy. The baby may remain in this position, but sometimes he or she may turn back to the breech position. You and your health care provider will discuss if an ECV is recommended for you and your baby. How will I delivery my baby if he or she is breech? You and your health care provider will discuss the best way to deliver your baby. If your baby is  breech, it is less likely that a vaginal delivery will be recommended due to the risks to you and your baby. Your health care provider may recommend that you deliver your baby through a Cesarean section (C-section). A C-section is the surgical delivery of a baby through an incision in the abdomen and the uterus. Summary  A breech birth is when a baby is born with the buttocks or feet first.  Having a breech birth may increase the risks to your baby.  Your health care provider may try to turn your baby in your uterus using a procedure called an external cephalic version (ECV).  If your baby cannot be turned to a head down position or if your baby remains in a breech position, your health care provider will make recommendations about the safest way to deliver your baby. This information is not intended to replace advice given to you by your health care provider. Make sure you discuss any questions you have with your health care provider. Document Released: 09/04/2006 Document Revised: 04/09/2017 Document Reviewed: 04/09/2017 Elsevier Interactive Patient Education  2019 [employer].

## 2018-08-28 ENCOUNTER — Other Ambulatory Visit: Payer: Self-pay

## 2018-08-28 ENCOUNTER — Inpatient Hospital Stay (HOSPITAL_COMMUNITY): Payer: No Typology Code available for payment source | Admitting: Anesthesiology

## 2018-08-28 LAB — RPR: RPR Ser Ql: NONREACTIVE

## 2018-08-28 MED ORDER — LIDOCAINE HCL (PF) 1 % IJ SOLN
INTRAMUSCULAR | Status: DC | PRN
  Administered 2018-08-28: 5 mL via EPIDURAL

## 2018-08-28 NOTE — Progress Notes (Signed)
FHTs before 0140 were good STV with 10x10s, gLTV. At [place], pt had a run of late appearing mild variables that corrected with position. At [place] she had another run of late appearing variables that continued despite pit being halved.   0400 Pit turned off.  Now  FHTS 140s, gSTV, 10x10s and no decels.

## 2018-08-28 NOTE — Anesthesia Pain Management Evaluation Note (Signed)
  CRNA Pain Management Visit Note  Patient: Lauren Guerrero, 43 y.o., female  "Hello I am a member of the anesthesia team at [facility]. We have an anesthesia team available at all times to provide care throughout the hospital, including epidural management and anesthesia for C-section. I don't know your plan for the delivery whether it a natural birth, water birth, IV sedation, nitrous supplementation, doula or epidural, but we want to meet your pain goals."   1.Was your pain managed to your expectations on prior hospitalizations?   Yes   2.What is your expectation for pain management during this hospitalization?     Epidural  3.How can we help you reach that goal? Epidural in place at time of visit  Record the patient's initial score and the patient's pain goal.   Pain: Patient sleeping - unable to assess  Pain Goal: Patient sleeping - unable to assess [facility] wants you to be able to say your pain was always managed very well.  Rayvon Char 08/28/2018

## 2018-08-28 NOTE — Progress Notes (Signed)
Pt comfortable now with epidural.  Vitals:   08/28/18 1501 08/28/18 1529 08/28/18 1609 08/28/18 1641  BP: 98/70 117/67 105/61 105/62  Pulse: (!) 130 87 80 82  Resp: 18 18 18 20   Temp:   97.9 F (36.6 C)   TempSrc:   Oral   SpO2:      Weight:      Height:       FHTs 130s, gSTV, gLTV, 10x10 accels, no decels. Toco q 3-4 SVE now loose 4/80/-2  Beside [place] fluid normal around baby with head occiput transverse L.  Pt finally in labor and baby reassuring.  Continue.  Lauren Guerrero

## 2018-08-28 NOTE — Anesthesia Procedure Notes (Signed)

## 2018-08-28 NOTE — Progress Notes (Signed)
Pt comfortable with epidural.  Vitals:   08/28/18 0636 08/28/18 0724 08/28/18 0753 08/28/18 0819  BP: (!) 103/58 (!) 105/51 109/68 121/63  Pulse: 76 85 77 74  Resp: 15 18 20 18   Temp: 98.6 F (37 C)  98.9 F (37.2 C)   TempSrc: Oral  Oral   SpO2:      Weight:      Height:       FHTS from 0430 to 0800 were 120s with gSTV and finally reactive - NST R.   Just a few minutes ago baseline went back to 140-150s with 2 mild variables in late presentation.    Toco q 3-4   SVE 3/70/-2 VTX   Pt desires to continue unless FHTs have persistent decels.

## 2018-08-29 ENCOUNTER — Inpatient Hospital Stay (HOSPITAL_COMMUNITY)
Admission: RE | Admit: 2018-08-29 | Discharge: 2018-08-29 | Disposition: A | Discharge status: Home / Self Care | Payer: No Typology Code available for payment source | Source: Ambulatory Visit | Attending: Obstetrics and Gynecology | Admitting: Obstetrics and Gynecology

## 2018-08-29 ENCOUNTER — Encounter (HOSPITAL_COMMUNITY): Payer: Self-pay

## 2018-08-29 MED ORDER — OXYCODONE-ACETAMINOPHEN 5-325 MG PO TABS: 2.0000 | ORAL_TABLET | ORAL | Status: DC | PRN

## 2018-08-29 MED ORDER — METHYLERGONOVINE MALEATE 0.2 MG PO TABS: 0.2000 mg | ORAL_TABLET | ORAL | Status: DC | PRN

## 2018-08-29 MED ORDER — MAGNESIUM HYDROXIDE 400 MG/5ML PO SUSP: 30.0000 mL | ORAL | Status: DC | PRN

## 2018-08-29 MED ORDER — FERROUS SULFATE 325 (65 FE) MG PO TABS
325.0000 mg | ORAL_TABLET | Freq: Two times a day (BID) | ORAL | Status: DC
  Administered 2018-08-29 – 2018-08-31 (×4): 325 mg via ORAL
  Filled 2018-08-29 (×4): qty 1

## 2018-08-29 MED ORDER — BENZOCAINE-MENTHOL 20-0.5 % EX AERO
1.0000 "application " | INHALATION_SPRAY | CUTANEOUS | Status: DC | PRN
  Administered 2018-08-29: 1 via TOPICAL
  Filled 2018-08-29: qty 56

## 2018-08-29 MED ORDER — ZOLPIDEM TARTRATE 5 MG PO TABS: 5.0000 mg | ORAL_TABLET | Freq: Every evening | ORAL | Status: DC | PRN

## 2018-08-29 MED ORDER — PRENATAL MULTIVITAMIN CH
1.0000 | ORAL_TABLET | Freq: Every day | ORAL | Status: DC
  Administered 2018-08-29 – 2018-08-30 (×2): 1 via ORAL
  Filled 2018-08-29 (×2): qty 1

## 2018-08-29 MED ORDER — SIMETHICONE 80 MG PO CHEW: 80.0000 mg | CHEWABLE_TABLET | ORAL | Status: DC | PRN

## 2018-08-29 MED ORDER — IBUPROFEN 800 MG PO TABS
800.0000 mg | ORAL_TABLET | Freq: Three times a day (TID) | ORAL | Status: DC
  Administered 2018-08-29 – 2018-08-31 (×8): 800 mg via ORAL
  Filled 2018-08-29 (×8): qty 1

## 2018-08-29 MED ORDER — ALBUTEROL SULFATE (2.5 MG/3ML) 0.083% IN NEBU: 2.5000 mg | INHALATION_SOLUTION | Freq: Four times a day (QID) | RESPIRATORY_TRACT | Status: DC | PRN

## 2018-08-29 MED ORDER — SODIUM CHLORIDE 0.9% FLUSH: 3.0000 mL | Freq: Two times a day (BID) | INTRAVENOUS | Status: DC

## 2018-08-29 MED ORDER — ALBUTEROL SULFATE HFA 108 (90 BASE) MCG/ACT IN AERS
2.0000 | INHALATION_SPRAY | Freq: Four times a day (QID) | RESPIRATORY_TRACT | Status: DC | PRN
  Filled 2018-08-29: qty 6.7

## 2018-08-29 MED ORDER — ONDANSETRON HCL 4 MG PO TABS: 4.0000 mg | ORAL_TABLET | ORAL | Status: DC | PRN

## 2018-08-29 MED ORDER — SODIUM CHLORIDE 0.9% FLUSH: 3.0000 mL | INTRAVENOUS | Status: DC | PRN

## 2018-08-29 MED ORDER — MEASLES, MUMPS & RUBELLA VAC IJ SOLR
0.5000 mL | Freq: Once | INTRAMUSCULAR | Status: DC
  Filled 2018-08-29: qty 0.5

## 2018-08-29 MED ORDER — DIBUCAINE 1 % RE OINT: 1.0000 "application " | TOPICAL_OINTMENT | RECTAL | Status: DC | PRN

## 2018-08-29 MED ORDER — ONDANSETRON HCL 4 MG/2ML IJ SOLN: 4.0000 mg | INTRAMUSCULAR | Status: DC | PRN

## 2018-08-29 MED ORDER — ACETAMINOPHEN 325 MG PO TABS
650.0000 mg | ORAL_TABLET | ORAL | Status: DC | PRN
  Filled 2018-08-29: qty 2

## 2018-08-29 MED ORDER — DIPHENHYDRAMINE HCL 25 MG PO CAPS: 25.0000 mg | ORAL_CAPSULE | Freq: Four times a day (QID) | ORAL | Status: DC | PRN

## 2018-08-29 MED ORDER — METHYLERGONOVINE MALEATE 0.2 MG/ML IJ SOLN: 0.2000 mg | INTRAMUSCULAR | Status: DC | PRN

## 2018-08-29 MED ORDER — WITCH HAZEL-GLYCERIN EX PADS: 1.0000 "application " | MEDICATED_PAD | CUTANEOUS | Status: DC | PRN

## 2018-08-29 MED ORDER — SODIUM CHLORIDE 0.9 % IV SOLN: 250.0000 mL | INTRAVENOUS | Status: DC | PRN

## 2018-08-29 MED ORDER — COCONUT OIL OIL: 1.0000 "application " | TOPICAL_OIL | Status: DC | PRN

## 2018-08-29 MED ORDER — SENNOSIDES-DOCUSATE SODIUM 8.6-50 MG PO TABS
2.0000 | ORAL_TABLET | ORAL | Status: DC
  Administered 2018-08-29 – 2018-08-30 (×2): 2 via ORAL
  Filled 2018-08-29 (×2): qty 2

## 2018-08-29 MED ORDER — TETANUS-DIPHTH-ACELL PERTUSSIS 5-2.5-18.5 LF-MCG/0.5 IM SUSP: 0.5000 mL | Freq: Once | INTRAMUSCULAR | Status: DC

## 2018-08-29 NOTE — Lactation Note (Signed)
This note was copied from a baby's chart. Lactation Consultation Note  Patient Name: Lauren Guerrero [id number] Date: 08/29/2018 Reason for consult: Initial assessment;Term  4 hours old FT female who is being exclusively BF by his mother, she's a P2 and experienced BF. She was able to BF her first child for 24 months but experienced some BF difficulties in the beginning. Mom self reported a low milk supply, she said she didn't see any drops of colostrum for the first or second day, "maybe few drops on day 3-4". But her milk didn't come in until a few days after that around a week.   She also voiced that her first baby had difficulties latching on, she was unsure if there were any frenulum issues, but noticed that mom herself has a wide gap between her two upper incisors. First baby eventually took the breast but mom had to do a lot of pumping and bottle in the mean time.  However mom reported (+) breast changes with this pregnancy and she's already been able to express colostrum when [facility] assisted with hand expression. Her right breast is the one giving her more drops, the left one only had a tiny droplet. She has a Medela DEBP at home.   Offered assistance with latch and mom agreed to wake baby up to feed. LC took baby STS to mom's right breast in football position but baby very sleepy, he wouldn't latch. An attempt was documented in Short. Mom was very engaged during [facility] consultation and had lots of questions. Discussed, normal newborn behavior, cluster feeding, feeding cues, pumping and baby sleeping cycle.  [place] set up a DEBP, instructions, cleaning and storage were reviewed, as well as milk storage guidelines. Mom is very proactive and plans to start pumping today. Mom would also voiced she'd like to see Lactation as an outpatient.  Feeding plan:  1. Encouraged mom to feed baby STS 8-12 times/24 hours or sooner if feeding cues are present 2. She'll pump every 3 hours and at least once at  night, and will feed baby any amount of EBM she may get 3. Hand expression and spoon feeding was also encouraged  BF brochure, BF resources and feeding diary were reviewed. Parents reported all questions and concerns were answered, they're both aware of [place] services and will call PRN.    Maternal Data Formula Feeding for Exclusion: No Has patient been taught Hand Expression?: Yes Does the patient have breastfeeding experience prior to this delivery?: Yes  Feeding Feeding Type: Breast Fed    Interventions Interventions: Breast feeding basics reviewed;DEBP;Assisted with latch;Skin to skin;Breast massage;Hand express;Breast compression;Adjust position;Support pillows  Lactation Tools Discussed/Used Tools: Pump Breast pump type: Double-Electric Breast Pump WIC Program: No Pump Review: Setup, frequency, and cleaning;Milk Storage Initiated by:: MPeck Date initiated:: 08/29/18   Consult Status Consult Status: Follow-up Date: 08/30/18 Follow-up type: [place] 08/29/2018, 1:46 PM

## 2018-08-29 NOTE — Progress Notes (Signed)
Patient was C/C/+2 and pushed for 30 minutes total with epidural.     Baby was having moderate to severe variable decels with contractions but had normal baseline with good long and short term variability in between. Pt offered and consented for VE- all risks d/w her.  VE applied 4 times with 2 popoffs to delivery of head. Shoulder dystocia for 20 sec relieved with mc roberts and supra pubic pressure.   VD female infant, Apgars 9,9, weight P.    The patient had a midline partial 3 degree laceration repaired with 2-0 vicryl R. Fundus was firm. EBL was expected amount. Placenta was delivered intact. Vagina was clear.  Delayed cord clamping done for 30-60 seconds while warming baby. Baby was vigorous and doing skin to skin with mother.  Lauren Guerrero

## 2018-08-29 NOTE — Progress Notes (Signed)
Pt comfortable with epidural.  Vitals:   08/28/18 2220 08/28/18 2258 08/28/18 2338 08/29/18 0010  BP: (!) 102/48 113/69 (!) 109/42 (!) 92/42  Pulse: 78 84 76 76  Resp:   15 16  Temp:      TempSrc:      SpO2:      Weight:      Height:       FHTs 120s, gSTV, NST R, scalp stim Toco q2-4 SVE 5/C/-2   Continue induction.

## 2018-08-30 LAB — CBC
HCT: 27 % — ABNORMAL LOW (ref 36.0–46.0)
Hemoglobin: 9.4 g/dL — ABNORMAL LOW (ref 12.0–15.0)
MCH: 30.1 pg (ref 26.0–34.0)
MCHC: 34.8 g/dL (ref 30.0–36.0)
MCV: 86.5 fL (ref 80.0–100.0)
Platelets: 160 10*3/uL (ref 150–400)
RBC: 3.12 MIL/uL — ABNORMAL LOW (ref 3.87–5.11)
RDW: 14.7 % (ref 11.5–15.5)
WBC: 10 10*3/uL (ref 4.0–10.5)
nRBC: 0 % (ref 0.0–0.2)

## 2018-08-30 NOTE — Lactation Note (Signed)
This note was copied from a baby's chart. Lactation Consultation Note  Patient Name: Lauren Guerrero [id number] Date: 08/30/2018 Reason for consult: Follow-up assessment;Term  P2 mother whose infant is now 50 hours old.  Mother breast fed her first child for 24 months but stated she had a low milk supply.  She pumped and bottle fed quite a bit with her first child. She hopes to exclusively  breast feed this child and has started using the DEBP to help increase milk supply.  Mother had no questions/concerns at the present time but would like lactation assistance for the next feeding.  I encouraged her to call for my help when she first begins to see baby showing feeding cues.  Mother verbalized understanding.     Maternal Data Formula Feeding for Exclusion: No Has patient been taught Hand Expression?: Yes Does the patient have breastfeeding experience prior to this delivery?: Yes  Feeding Feeding Type: Bottle Fed - Formula Nipple Type: Slow - flow  LATCH Score                   Interventions    Lactation Tools Discussed/Used WIC Program: No   Consult Status Consult Status: Follow-up Date: 08/31/18 Follow-up type: In-patient    Lauren Guerrero 08/30/2018, 12:22 PM

## 2018-08-30 NOTE — Anesthesia Postprocedure Evaluation (Signed)
Anesthesia Post Note  Patient: Lauren Guerrero  Procedure(s) Performed: AN AD HOC LABOR EPIDURAL     Patient location during evaluation: Mother Baby Anesthesia Type: Epidural Level of consciousness: awake and alert Pain management: pain level controlled Vital Signs Assessment: post-procedure vital signs reviewed and stable Respiratory status: spontaneous breathing, nonlabored ventilation and respiratory function stable Cardiovascular status: stable Postop Assessment: no headache, no backache and epidural receding Anesthetic complications: no    Last Vitals:  Vitals:   08/29/18 2319 08/30/18 0525  BP: (!) 114/58 109/64  Pulse: 89 79  Resp: 18 18  Temp: (!) 36.4 C 36.8 C  SpO2:  100%    Last Pain:  Vitals:   08/30/18 0700  TempSrc:   PainSc: Asleep   Pain Goal: Patients Stated Pain Goal: 2 (08/27/18 2201)                 Riki Sheer

## 2018-08-30 NOTE — Progress Notes (Signed)
Patient is doing well.  She is ambulating, voiding, tolerating PO.  Pain control is good.  Lochia is appropriate  Vitals:   08/29/18 1607 08/29/18 1957 08/29/18 2319 08/30/18 0525  BP: 122/60 134/60 (!) 114/58 109/64  Pulse: 91 88 89 79  Resp: 20 14 18 18   Temp: 98.2 F (36.8 C) 98.5 F (36.9 C) (!) 97.5 F (36.4 C) 98.2 F (36.8 C)  TempSrc:  Oral Oral Oral  SpO2:    100%  Weight:      Height:        NAD Fundus firm Ext: 2+ edema b/l  Lab Results  Component Value Date   WBC 10.0 08/30/2018   HGB 9.4 (L) 08/30/2018   HCT 27.0 (L) 08/30/2018   MCV 86.5 08/30/2018   PLT 160 08/30/2018    --/--/A POS (01/31 0820)/RImmune  A/P 43 y.o. [id number] PPD#1 s/p TSVD. Routine care.   Desires discharge tomorrow  Desires circumcision. Discussed r/b/a of the procedure. Reviewed that circumcision is an elective surgical procedure and not considered medically necessary. Reviewed the risks of the procedure including the risk of infection, bleeding, damage to surrounding structures, including scrotum, shaft, urethra and head of penis, and an undesired cosmetic effect requiring additional procedures for revision. Consent signed.     [place]

## 2018-08-31 MED ORDER — DOCUSATE SODIUM 100 MG PO CAPS: 100.0000 mg | ORAL_CAPSULE | Freq: Two times a day (BID) | ORAL | 0 refills | Status: DC

## 2018-08-31 MED ORDER — IBUPROFEN 600 MG PO TABS: 600.0000 mg | ORAL_TABLET | Freq: Four times a day (QID) | ORAL | 0 refills | Status: DC | PRN

## 2018-08-31 MED ORDER — OXYCODONE-ACETAMINOPHEN 5-325 MG PO TABS: 1.0000 | ORAL_TABLET | Freq: Four times a day (QID) | ORAL | 0 refills | Status: DC | PRN

## 2018-08-31 NOTE — Lactation Note (Signed)
This note was copied from a baby's chart. Lactation Consultation Note  Patient Name: Lauren Guerrero [id number] Date: 08/31/2018 Reason for consult: Follow-up assessment Mom is latching with the nipple shield and supplementing with formula using a slow flow bottle.  Baby just finished a feeding and is sleeping on mom's chest.  She has a breast pump at home.  Instructed to post pump 4-6 times per day when using the nipples shield.  Mom has no questions or concerns.  Lactation outpatient services and support reviewed and encouraged prn.  Maternal Data    Feeding    LATCH Score                   Interventions    Lactation Tools Discussed/Used     Consult Status Consult Status: Complete Follow-up type: Call as needed    Ave Filter 08/31/2018, 9:14 AM

## 2018-08-31 NOTE — Lactation Note (Signed)
This note was copied from a baby's chart. Lactation Consultation Note  Patient Name: Lauren Guerrero [id number] Date: 08/31/2018   Parents requested assistance. Mom has excellent technique, but infant not latching deeply enough. Infant had excellent suction on a gloved finger. Mom's nipple seems somewhat short-shafted, so a nipple shield was applied to help nipple reach juncture of hard & soft palate.   A nipple shield (size 20) was applied & prefilled with formula. We then progressed to using a 5 Fr/syringe. Infant did quite well. The plunger did not need to be pushed when infant was supplemented at breast. In an attempt to widen infant's gape (and b/c it is easier to compress) a size 24 nipple shield was used. Mom felt comfortable with all interventions.   Mom reports that with her 1st child, she was only typically able to obtain 68mL/breast when pumping. Mom is hoping for a better milk supply with this infant.   Lauren Panning, RN given update.   Lauren Guerrero [facility] 08/31/2018, 12:32 AM

## 2018-08-31 NOTE — Discharge Summary (Signed)
Obstetric Discharge Summary Reason for Admission: induction of labor Prenatal Procedures: NST and ultrasound Intrapartum Procedures: spontaneous vaginal delivery with vacuum assistance Postpartum Procedures: none Complications-Operative and Postpartum: partial 3rd degree perineal laceration Hemoglobin  Date Value Ref Range Status  08/30/2018 9.4 (L) 12.0 - 15.0 g/dL Final   HCT  Date Value Ref Range Status  08/30/2018 27.0 (L) 36.0 - 46.0 % Final    Physical Exam:  General: alert, cooperative and appears stated age 43: appropriate Uterine Fundus: firm Incision: healing well DVT Evaluation: No evidence of DVT seen on physical exam.  Discharge Diagnoses: Term Pregnancy-delivered  Discharge Information: Date: 08/31/2018 Activity: pelvic rest Diet: routine Medications: Ibuprofen and Colace Condition: improved Instructions: refer to practice specific booklet Discharge to: home Follow-up Information    Bobbye Charleston, MD. Schedule an appointment as soon as possible for a visit in 4 week(s).   Specialty:  Obstetrics and Gynecology Why:  For a postpartum evlauation Contact information: [address] [place] [address] [phone]           Newborn Data: Live born female  Birth Weight: 8 lb 5 oz (3770 g) APGAR: 54, 9  Newborn Delivery   Time head delivered:  08/29/2018 08:57:00 Birth date/time:  08/29/2018 08:57:00 Delivery type:  Vaginal, Vacuum (Extractor)     Home with mother.  Vanessa Kick 08/31/2018, 9:00 AM

## 2018-09-01 MED FILL — OXYCODONE-ACETAMINOPHEN 5-3: 5-325 | 2 days supply | Qty: 10 | Fill #0

## 2018-09-01 MED FILL — IBUPROFEN 600 MG TABLET: 600 | 22 days supply | Qty: 90 | Fill #0

## 2018-09-05 ENCOUNTER — Inpatient Hospital Stay (HOSPITAL_COMMUNITY)
Admission: AD | Admit: 2018-09-05 | Payer: No Typology Code available for payment source | Source: Home / Self Care | Admitting: Obstetrics and Gynecology

## 2018-10-07 ENCOUNTER — Encounter: Payer: No Typology Code available for payment source | Admitting: Family Medicine

## 2018-11-29 ENCOUNTER — Encounter: Payer: No Typology Code available for payment source | Admitting: Family Medicine

## 2019-04-01 ENCOUNTER — Ambulatory Visit (INDEPENDENT_AMBULATORY_CARE_PROVIDER_SITE_OTHER): Payer: No Typology Code available for payment source | Admitting: Family Medicine

## 2019-04-01 ENCOUNTER — Other Ambulatory Visit: Payer: Self-pay

## 2019-04-01 ENCOUNTER — Encounter: Payer: Self-pay | Admitting: Family Medicine

## 2019-04-01 VITALS — BP 110/62 | HR 79 | Temp 98.6°F | Resp 16 | Ht 65.0 in | Wt 175.8 lb

## 2019-04-01 DIAGNOSIS — L84 Corns and callosities: Secondary | ICD-10-CM

## 2019-04-01 DIAGNOSIS — Z23 Encounter for immunization: Secondary | ICD-10-CM | POA: Diagnosis not present

## 2019-04-01 DIAGNOSIS — M25562 Pain in left knee: Secondary | ICD-10-CM

## 2019-04-01 NOTE — Patient Instructions (Signed)
Follow up as needed or as scheduled ICE! Ibuprofen as needed We'll call you with your PT appt Soak your foot and use a pumice stone to smooth the callous Apply lotion or vasoline before bed and cover with a sock to soften skin Call with any questions or concerns Stay Safe!! Hang in there!!

## 2019-04-01 NOTE — Progress Notes (Signed)
   Subjective:    Patient ID: Lauren Guerrero, female    DOB: November 23, 1975, 43 y.o.   MRN: [id number]  HPI L knee- occurred when she 'twisted out of the bed wrong'.  ~2 weeks ago.  Some initial swelling.  Some TTP over superior patella and quad tendon.  Pain w/ weight bearing.  Pain is worse going upstairs.  No cracking or popping.  Pain is anterior.  Using heat w/ some relief   Review of Systems For ROS see HPI     Objective:   Physical Exam Vitals signs reviewed.  Constitutional:      General: She is not in acute distress.    Appearance: Normal appearance. She is not ill-appearing.  HENT:     Head: Normocephalic and atraumatic.  Cardiovascular:     Pulses: Normal pulses.  Musculoskeletal:        General: Tenderness (mild TTP over L quadriceps tendon) present. No swelling or deformity.     Comments: No TTP over L patellar tendon, medial or lateral joint lines Pain w/ full extension > flexion  Skin:    General: Skin is warm and dry.     Comments: Callous of plantar surface of R foot  Neurological:     Mental Status: She is alert.           Assessment & Plan:  L anterior knee pain- new.  Suspect quad tendon strain.  Ice, NSAIDs prn, refer to PT for HEP.  No apparent bony injury.  Reviewed supportive care and red flags that should prompt return.  Pt expressed understanding and is in agreement w/ plan.   Callous- new.  Reviewed at home care.  Pt expressed understanding and is in agreement w/ plan.

## 2019-04-15 ENCOUNTER — Telehealth: Payer: Self-pay | Admitting: Family Medicine

## 2019-04-15 NOTE — Telephone Encounter (Signed)
I have placed a physical therapy eval. Form in the bin upfront with a charge sheet.

## 2019-04-15 NOTE — Telephone Encounter (Signed)
Paperwork given to PCP for completion.  

## 2019-04-18 NOTE — Telephone Encounter (Signed)
Form completed and placed in basket  

## 2019-04-18 NOTE — Telephone Encounter (Signed)
fyi

## 2019-04-18 NOTE — Telephone Encounter (Signed)
paperwork has been faxed

## 2019-05-26 ENCOUNTER — Encounter: Payer: No Typology Code available for payment source | Admitting: Family Medicine

## 2019-06-16 ENCOUNTER — Ambulatory Visit (INDEPENDENT_AMBULATORY_CARE_PROVIDER_SITE_OTHER): Payer: No Typology Code available for payment source | Admitting: Family Medicine

## 2019-06-16 ENCOUNTER — Encounter: Payer: Self-pay | Admitting: Family Medicine

## 2019-06-16 ENCOUNTER — Encounter: Payer: No Typology Code available for payment source | Admitting: Family Medicine

## 2019-06-16 ENCOUNTER — Other Ambulatory Visit: Payer: Self-pay

## 2019-06-16 VITALS — BP 120/89 | HR 85 | Temp 97.5°F | Resp 16 | Ht 65.0 in | Wt 173.4 lb

## 2019-06-16 DIAGNOSIS — E559 Vitamin D deficiency, unspecified: Secondary | ICD-10-CM | POA: Insufficient documentation

## 2019-06-16 DIAGNOSIS — Z0000 Encounter for general adult medical examination without abnormal findings: Secondary | ICD-10-CM

## 2019-06-16 DIAGNOSIS — E663 Overweight: Secondary | ICD-10-CM | POA: Diagnosis not present

## 2019-06-16 DIAGNOSIS — E669 Obesity, unspecified: Secondary | ICD-10-CM | POA: Insufficient documentation

## 2019-06-16 LAB — HEPATIC FUNCTION PANEL
ALT: 13 U/L (ref 0–35)
AST: 16 U/L (ref 0–37)
Albumin: 4.4 g/dL (ref 3.5–5.2)
Alkaline Phosphatase: 71 U/L (ref 39–117)
Bilirubin, Direct: 0.1 mg/dL (ref 0.0–0.3)
Total Bilirubin: 0.5 mg/dL (ref 0.2–1.2)
Total Protein: 7 g/dL (ref 6.0–8.3)

## 2019-06-16 LAB — LIPID PANEL
Cholesterol: 188 mg/dL (ref 0–200)
HDL: 66.4 mg/dL (ref >39.00)
LDL Cholesterol: 114 mg/dL — ABNORMAL HIGH (ref 0–99)
NonHDL: 121.53
Total CHOL/HDL Ratio: 3
Triglycerides: 39 mg/dL (ref 0.0–149.0)
VLDL: 7.8 mg/dL (ref 0.0–40.0)

## 2019-06-16 LAB — BASIC METABOLIC PANEL
BUN: 14 mg/dL (ref 6–23)
CO2: 27 mEq/L (ref 19–32)
Calcium: 9.5 mg/dL (ref 8.4–10.5)
Chloride: 104 mEq/L (ref 96–112)
Creatinine, Ser: 0.72 mg/dL (ref 0.40–1.20)
GFR: 107.23 mL/min (ref >60.00)
Glucose, Bld: 84 mg/dL (ref 70–99)
Potassium: 3.9 mEq/L (ref 3.5–5.1)
Sodium: 140 mEq/L (ref 135–145)

## 2019-06-16 LAB — CBC WITH DIFFERENTIAL/PLATELET
Basophils Absolute: 0.1 10*3/uL (ref 0.0–0.1)
Basophils Relative: 1.3 % (ref 0.0–3.0)
Eosinophils Absolute: 0.1 10*3/uL (ref 0.0–0.7)
Eosinophils Relative: 1.8 % (ref 0.0–5.0)
HCT: 37.8 % (ref 36.0–46.0)
Hemoglobin: 12.5 g/dL (ref 12.0–15.0)
Lymphocytes Relative: 42.1 % (ref 12.0–46.0)
Lymphs Abs: 1.8 10*3/uL (ref 0.7–4.0)
MCHC: 33 g/dL (ref 30.0–36.0)
MCV: 85.5 fl (ref 78.0–100.0)
Monocytes Absolute: 0.2 10*3/uL (ref 0.1–1.0)
Monocytes Relative: 4.8 % (ref 3.0–12.0)
Neutro Abs: 2.2 10*3/uL (ref 1.4–7.7)
Neutrophils Relative %: 50 % (ref 43.0–77.0)
Platelets: 261 10*3/uL (ref 150.0–400.0)
RBC: 4.42 Mil/uL (ref 3.87–5.11)
RDW: 13.6 % (ref 11.5–15.5)
WBC: 4.4 10*3/uL (ref 4.0–10.5)

## 2019-06-16 LAB — VITAMIN D 25 HYDROXY (VIT D DEFICIENCY, FRACTURES): VITD: 30.75 ng/mL (ref 30.00–100.00)

## 2019-06-16 LAB — TSH: TSH: 1.11 u[IU]/mL (ref 0.35–4.50)

## 2019-06-16 NOTE — Assessment & Plan Note (Signed)
Discussed need for healthy diet and regular exercise.  Check labs to risk stratify. 

## 2019-06-16 NOTE — Patient Instructions (Signed)
Follow up in 1 year or as needed We'll notify you of your lab results and make any changes if needed Try and walk or do exercise videos at home whenever you can squeeze it in Call with any questions or concerns Stay Safe!  Stay Healthy! Happy Thanksgiving!!!

## 2019-06-16 NOTE — Progress Notes (Signed)
   Subjective:    Patient ID: Lauren Guerrero, female    DOB: [birth year], 43 y.o.   MRN: [id number]  HPI CPE- UTD on GYN, flu shot, Tdap.  No concerns today.   Review of Systems Patient reports no vision/ hearing changes, adenopathy,fever, weight change,  persistant/recurrent hoarseness , swallowing issues, chest pain, palpitations, edema, persistant/recurrent cough, hemoptysis, dyspnea (rest/exertional/paroxysmal nocturnal), gastrointestinal bleeding (melena, rectal bleeding), abdominal pain, significant heartburn, bowel changes, GU symptoms (dysuria, hematuria, incontinence), Gyn symptoms (abnormal  bleeding, pain),  syncope, focal weakness, memory loss, numbness & tingling, skin/hair/nail changes, abnormal bruising or bleeding, anxiety, or depression.     Objective:   Physical Exam General Appearance:    Alert, cooperative, no distress, appears stated age  Head:    Normocephalic, without obvious abnormality, atraumatic  Eyes:    PERRL, conjunctiva/corneas clear, EOM's intact, fundi    benign, both eyes  Ears:    Normal TM's and external ear canals, both ears  Nose:   Deferred due to COVID  Throat:   Neck:   Supple, symmetrical, trachea midline, no adenopathy;    Thyroid: no enlargement/tenderness/nodules  Back:     Symmetric, no curvature, ROM normal, no CVA tenderness  Lungs:     Clear to auscultation bilaterally, respirations unlabored  Chest Wall:    No tenderness or deformity   Heart:    Regular rate and rhythm, S1 and S2 normal, no murmur, rub   or gallop  Breast Exam:    Deferred to GYN  Abdomen:     Soft, non-tender, bowel sounds active all four quadrants,    no masses, no organomegaly  Genitalia:    Deferred to GYN  Rectal:    Extremities:   Extremities normal, atraumatic, no cyanosis or edema  Pulses:   2+ and symmetric all extremities  Skin:   Skin color, texture, turgor normal, no rashes or lesions  Lymph nodes:   Cervical, supraclavicular, and axillary nodes  normal  Neurologic:   CNII-XII intact, normal strength, sensation and reflexes    throughout          Assessment & Plan:

## 2019-06-16 NOTE — Assessment & Plan Note (Signed)
Check labs, replete prn.

## 2019-06-16 NOTE — Assessment & Plan Note (Signed)
Pt's PE WNL w/ exception of being overweight.  UTD on GYN, immunizations.  Wants to hold on mammogram until she is done breast feeding.  Check labs.  Anticipatory guidance provided.

## 2019-06-17 ENCOUNTER — Encounter: Payer: Self-pay | Admitting: General Practice

## 2020-03-15 ENCOUNTER — Telehealth: Payer: Self-pay | Admitting: Family Medicine

## 2020-03-15 NOTE — Telephone Encounter (Signed)
Pt called in asking for an order for an US of the thyroid to be sent [facility] imaging, she states this was discuss at her last appt with Dr. Birdie Riddle.   Please advise

## 2020-03-15 NOTE — Telephone Encounter (Signed)
Did not see notes where ok to order. Please advise

## 2020-03-15 NOTE — Telephone Encounter (Signed)
Left message to return call to our office.  

## 2020-03-15 NOTE — Telephone Encounter (Signed)
Since last visit was ~8 months ago, we need an updated OV prior to entering orders.  This can be done virtually if she prefers or in office

## 2020-04-03 ENCOUNTER — Other Ambulatory Visit: Payer: Self-pay

## 2020-04-03 ENCOUNTER — Ambulatory Visit (INDEPENDENT_AMBULATORY_CARE_PROVIDER_SITE_OTHER): Payer: No Typology Code available for payment source | Admitting: Family Medicine

## 2020-04-03 ENCOUNTER — Encounter: Payer: Self-pay | Admitting: Family Medicine

## 2020-04-03 VITALS — BP 120/81 | HR 69 | Temp 97.9°F | Resp 16 | Ht 65.0 in | Wt 180.0 lb

## 2020-04-03 DIAGNOSIS — E042 Nontoxic multinodular goiter: Secondary | ICD-10-CM

## 2020-04-03 LAB — T3, FREE: T3, Free: 3.3 pg/mL (ref 2.3–4.2)

## 2020-04-03 LAB — T4, FREE: Free T4: 0.78 ng/dL (ref 0.60–1.60)

## 2020-04-03 LAB — TSH: TSH: 1.42 u[IU]/mL (ref 0.35–4.50)

## 2020-04-03 NOTE — Patient Instructions (Signed)
Follow up as needed or as scheduled We'll notify you of your lab results and make any changes if needed We'll call you with the ultrasound appt Call with any questions or concerns Stay Safe!  Stay Healthy!

## 2020-04-03 NOTE — Assessment & Plan Note (Signed)
Pt has known thyroid nodules but has not had them evaluated in >2 yrs.  Will check labs and get repeat US as brother had recent thyroid removal.  Pt expressed understanding and is in agreement w/ plan.

## 2020-04-03 NOTE — Progress Notes (Signed)
   Subjective:    Patient ID: Lauren Guerrero, female    DOB: [birth year], 44 y.o.   MRN: [id number]  HPI Multiple thyroid nodules- [place] was ordered in 2019 but never completed.  Brother recently had thyroidectomy for non-cancerous reasons.  No obvious growth or change to her nodules.  No pain, no trouble swallowing.   Review of Systems For ROS see HPI   This visit occurred during the SARS-CoV-2 public health emergency.  Safety protocols were in place, including screening questions prior to the visit, additional usage of staff PPE, and extensive cleaning of exam room while observing appropriate contact time as indicated for disinfecting solutions.       Objective:   Physical Exam Vitals reviewed.  Constitutional:      General: She is not in acute distress.    Appearance: Normal appearance. She is not ill-appearing.  HENT:     Head: Normocephalic and atraumatic.  Neck:     Comments: Mild thyromegaly w/o discrete nodularity Musculoskeletal:     Cervical back: Normal range of motion and neck supple.  Lymphadenopathy:     Cervical: No cervical adenopathy.  Neurological:     General: No focal deficit present.     Mental Status: She is alert and oriented to person, place, and time.  Psychiatric:        Mood and Affect: Mood normal.        Behavior: Behavior normal.        Thought Content: Thought content normal.           Assessment & Plan:

## 2020-04-04 ENCOUNTER — Encounter: Payer: Self-pay | Admitting: General Practice

## 2021-01-17 ENCOUNTER — Other Ambulatory Visit (HOSPITAL_COMMUNITY): Payer: Self-pay

## 2021-01-17 MED ORDER — MISOPROSTOL 200 MCG PO TABS
ORAL_TABLET | ORAL | 1 refills | Status: DC
  Filled 2021-01-17: qty 4, 4d supply, fill #0

## 2021-01-21 ENCOUNTER — Other Ambulatory Visit (HOSPITAL_COMMUNITY): Payer: Self-pay

## 2021-01-23 ENCOUNTER — Encounter: Payer: Self-pay | Admitting: *Deleted

## 2021-02-08 ENCOUNTER — Other Ambulatory Visit (HOSPITAL_COMMUNITY): Payer: Self-pay

## 2021-02-08 MED ORDER — CARESTART COVID-19 HOME TEST VI KIT
PACK | 0 refills | Status: DC
  Filled 2021-02-08: qty 4, 4d supply, fill #0

## 2021-04-22 ENCOUNTER — Ambulatory Visit: Payer: No Typology Code available for payment source | Admitting: Family Medicine

## 2021-04-23 ENCOUNTER — Ambulatory Visit (INDEPENDENT_AMBULATORY_CARE_PROVIDER_SITE_OTHER): Payer: No Typology Code available for payment source | Admitting: Registered Nurse

## 2021-04-23 ENCOUNTER — Encounter: Payer: Self-pay | Admitting: Registered Nurse

## 2021-04-23 ENCOUNTER — Other Ambulatory Visit: Payer: Self-pay

## 2021-04-23 VITALS — BP 109/56 | HR 92 | Temp 98.2°F | Resp 18 | Ht 65.0 in | Wt 180.4 lb

## 2021-04-23 DIAGNOSIS — M25472 Effusion, left ankle: Secondary | ICD-10-CM | POA: Diagnosis not present

## 2021-04-23 NOTE — Progress Notes (Signed)
Established Patient Office Visit  Subjective:  Patient ID: Lauren Guerrero, female    DOB: 06-Dec-1975  Age: 45 y.o. MRN: [id number]  CC:  Chief Complaint  Patient presents with   Ankle Pain    Patient states she has been having some ankle pain since may that seems to comes and goes.    HPI Debar K Chillemi-Jegede presents for ankle swelling No acute pain, injury or trauma Swelling around medial malleolus.  Worse as day goes on. Better after elevation No redness, warmth, or distal symptoms.  Past Medical History:  Diagnosis Date   Anemia    Fibroid    Medical history non-contributory     Past Surgical History:  Procedure Laterality Date   NO PAST SURGERIES     WISDOM TOOTH EXTRACTION      Family History  Problem Relation Age of Onset   Thyroid disease Mother     Social History   Socioeconomic History   Marital status: Married    Spouse name: Not on file   Number of children: Not on file   Years of education: Not on file   Highest education level: Not on file  Occupational History   Not on file  Tobacco Use   Smoking status: Never   Smokeless tobacco: Never  Vaping Use   Vaping Use: Never used  Substance and Sexual Activity   Alcohol use: Yes    Comment: socially   Drug use: No   Sexual activity: Yes  Other Topics Concern   Not on file  Social History Narrative   Not on file   Social Determinants of Health   Financial Resource Strain: Not on file  Food Insecurity: Not on file  Transportation Needs: Not on file  Physical Activity: Not on file  Stress: Not on file  Social Connections: Not on file  Intimate Partner Violence: Not on file    Outpatient Medications Prior to Visit  Medication Sig Dispense Refill   Multiple Vitamins-Minerals (MULTIVITAMIN ADULT PO) Take by mouth.     COVID-19 At Home Antigen Test [facility] COVID-19 HOME TEST) KIT Use as directed within package instructions. (Patient not taking: Reported on 04/23/2021) 4 each 0    misoprostol (CYTOTEC) 200 MCG tablet Take 1 tablet by mouth daily for 1 day as directed (Patient not taking: Reported on 04/23/2021) 4 tablet 1   No facility-administered medications prior to visit.    Allergies  Allergen Reactions   Peanut-Containing Drug Products Anaphylaxis and Hives   Penicillins Hives    Did it involve swelling of the face/tongue/throat, SOB, or low BP? No Did it involve sudden or severe rash/hives, skin peeling, or any reaction on the inside of your mouth or nose? No Did you need to seek medical attention at a hospital or doctor's office? No When did it last happen?       If all above answers are "NO", may proceed with cephalosporin use.   Sulfa Antibiotics Hives    ROS Review of Systems  Constitutional: Negative.   HENT: Negative.    Eyes: Negative.   Respiratory: Negative.    Cardiovascular: Negative.   Gastrointestinal: Negative.   Endocrine: Negative.   Genitourinary: Negative.   Musculoskeletal:  Positive for joint swelling. Negative for arthralgias, back pain, gait problem, myalgias, neck pain and neck stiffness.  Skin: Negative.   Allergic/Immunologic: Negative.   Neurological: Negative.   Hematological: Negative.   Psychiatric/Behavioral: Negative.    All other systems reviewed and are negative.  Objective:    Physical Exam Vitals and nursing note reviewed.  Constitutional:      General: She is not in acute distress.    Appearance: Normal appearance. She is normal weight. She is not ill-appearing, toxic-appearing or diaphoretic.  Cardiovascular:     Rate and Rhythm: Normal rate and regular rhythm.     Heart sounds: Normal heart sounds. No murmur heard.   No friction rub. No gallop.  Pulmonary:     Effort: Pulmonary effort is normal. No respiratory distress.     Breath sounds: Normal breath sounds. No stridor. No wheezing, rhonchi or rales.  Chest:     Chest wall: No tenderness.  Musculoskeletal:        General: Swelling (mild +1  pitting edema L medial malleolus.) present. No tenderness, deformity or signs of injury. Normal range of motion.     Right lower leg: No edema.     Left lower leg: No edema.  Skin:    General: Skin is warm and dry.     Capillary Refill: Capillary refill takes less than 2 seconds. Neurovascularly intact through LLE Neurological:     General: No focal deficit present.     Mental Status: She is alert and oriented to person, place, and time. Mental status is at baseline.  Psychiatric:        Mood and Affect: Mood normal.        Behavior: Behavior normal.        Thought Content: Thought content normal.    BP (!) 109/56   Pulse 92   Temp 98.2 F (36.8 C) (Temporal)   Resp 18   Ht 5' 5"  (1.651 m)   Wt 180 lb 6.4 oz (81.8 kg)   SpO2 99%   BMI 30.02 kg/m  Wt Readings from Last 3 Encounters:  04/23/21 180 lb 6.4 oz (81.8 kg)  04/03/20 180 lb (81.6 kg)  06/16/19 173 lb 6 oz (78.6 kg)     There are no preventive care reminders to display for this patient.  There are no preventive care reminders to display for this patient.  Lab Results  Component Value Date   TSH 1.42 04/03/2020   Lab Results  Component Value Date   WBC 4.4 06/16/2019   HGB 12.5 06/16/2019   HCT 37.8 06/16/2019   MCV 85.5 06/16/2019   PLT 261.0 06/16/2019   Lab Results  Component Value Date   NA 140 06/16/2019   K 3.9 06/16/2019   CO2 27 06/16/2019   GLUCOSE 84 06/16/2019   BUN 14 06/16/2019   CREATININE 0.72 06/16/2019   BILITOT 0.5 06/16/2019   ALKPHOS 71 06/16/2019   AST 16 06/16/2019   ALT 13 06/16/2019   PROT 7.0 06/16/2019   ALBUMIN 4.4 06/16/2019   CALCIUM 9.5 06/16/2019   GFR 107.23 06/16/2019   Lab Results  Component Value Date   CHOL 188 06/16/2019   Lab Results  Component Value Date   HDL 66.40 06/16/2019   Lab Results  Component Value Date   LDLCALC 114 (H) 06/16/2019   Lab Results  Component Value Date   TRIG 39.0 06/16/2019   Lab Results  Component Value Date    CHOLHDL 3 06/16/2019   No results found for: HGBA1C    Assessment & Plan:   Problem List Items Addressed This Visit   None Visit Diagnoses     Left ankle swelling    -  Primary   Relevant Orders   DG Ankle Complete  Left   Ambulatory referral to Podiatry       No orders of the defined types were placed in this encounter.   Follow-up: No follow-ups on file.   PLAN Appears as fairly benign dependent edema. Given no redness, heat, or pain, limited concern for DVT. Will send for xray per pt request, podiatry referral per pt request Discussed compression and elevation as mainstays. Patient encouraged to call clinic with any questions, comments, or concerns.  Maximiano Coss, NP

## 2021-04-23 NOTE — Patient Instructions (Signed)
° ° ° °  If you have lab work done today you will be contacted with your lab results within the next 2 weeks.  If you have not heard from us then please contact us. The fastest way to get your results is to register for My Chart. ° ° °IF you received an x-ray today, you will receive an invoice from [facility]. Please contact [facility] at [phone] with questions or concerns regarding your invoice.  ° °IF you received labwork today, you will receive an invoice from [facility]. Please contact [facility] at [phone] with questions or concerns regarding your invoice.  ° °Our billing staff will not be able to assist you with questions regarding bills from these companies. ° °You will be contacted with the lab results as soon as they are available. The fastest way to get your results is to activate your My Chart account. Instructions are located on the last page of this paperwork. If you have not heard from us regarding the results in 2 weeks, please contact this office. °  ° ° ° °

## 2021-06-13 ENCOUNTER — Ambulatory Visit (INDEPENDENT_AMBULATORY_CARE_PROVIDER_SITE_OTHER): Payer: No Typology Code available for payment source | Admitting: Family Medicine

## 2021-06-13 ENCOUNTER — Encounter: Payer: Self-pay | Admitting: Family Medicine

## 2021-06-13 ENCOUNTER — Other Ambulatory Visit (HOSPITAL_COMMUNITY): Payer: Self-pay

## 2021-06-13 VITALS — BP 108/74 | HR 91 | Temp 97.7°F | Resp 16 | Ht 65.0 in | Wt 180.8 lb

## 2021-06-13 DIAGNOSIS — E559 Vitamin D deficiency, unspecified: Secondary | ICD-10-CM | POA: Diagnosis not present

## 2021-06-13 DIAGNOSIS — E042 Nontoxic multinodular goiter: Secondary | ICD-10-CM

## 2021-06-13 DIAGNOSIS — Z0000 Encounter for general adult medical examination without abnormal findings: Secondary | ICD-10-CM

## 2021-06-13 DIAGNOSIS — Z1231 Encounter for screening mammogram for malignant neoplasm of breast: Secondary | ICD-10-CM

## 2021-06-13 DIAGNOSIS — E669 Obesity, unspecified: Secondary | ICD-10-CM

## 2021-06-13 LAB — BASIC METABOLIC PANEL
BUN: 10 mg/dL (ref 6–23)
CO2: 31 mEq/L (ref 19–32)
Calcium: 9.3 mg/dL (ref 8.4–10.5)
Chloride: 102 mEq/L (ref 96–112)
Creatinine, Ser: 0.76 mg/dL (ref 0.40–1.20)
GFR: 95.26 mL/min (ref >60.00)
Glucose, Bld: 79 mg/dL (ref 70–99)
Potassium: 4 mEq/L (ref 3.5–5.1)
Sodium: 138 mEq/L (ref 135–145)

## 2021-06-13 LAB — CBC WITH DIFFERENTIAL/PLATELET
Basophils Absolute: 0.1 10*3/uL (ref 0.0–0.1)
Basophils Relative: 1.1 % (ref 0.0–3.0)
Eosinophils Absolute: 0.1 10*3/uL (ref 0.0–0.7)
Eosinophils Relative: 2.2 % (ref 0.0–5.0)
HCT: 38.7 % (ref 36.0–46.0)
Hemoglobin: 12.8 g/dL (ref 12.0–15.0)
Lymphocytes Relative: 31.4 % (ref 12.0–46.0)
Lymphs Abs: 1.5 10*3/uL (ref 0.7–4.0)
MCHC: 33 g/dL (ref 30.0–36.0)
MCV: 85.4 fl (ref 78.0–100.0)
Monocytes Absolute: 0.3 10*3/uL (ref 0.1–1.0)
Monocytes Relative: 5.4 % (ref 3.0–12.0)
Neutro Abs: 2.9 10*3/uL (ref 1.4–7.7)
Neutrophils Relative %: 59.9 % (ref 43.0–77.0)
Platelets: 288 10*3/uL (ref 150.0–400.0)
RBC: 4.54 Mil/uL (ref 3.87–5.11)
RDW: 13.6 % (ref 11.5–15.5)
WBC: 4.9 10*3/uL (ref 4.0–10.5)

## 2021-06-13 LAB — HEPATIC FUNCTION PANEL
ALT: 13 U/L (ref 0–35)
AST: 19 U/L (ref 0–37)
Albumin: 4.5 g/dL (ref 3.5–5.2)
Alkaline Phosphatase: 60 U/L (ref 39–117)
Bilirubin, Direct: 0.1 mg/dL (ref 0.0–0.3)
Total Bilirubin: 0.5 mg/dL (ref 0.2–1.2)
Total Protein: 7.3 g/dL (ref 6.0–8.3)

## 2021-06-13 LAB — HEMOGLOBIN A1C: Hgb A1c MFr Bld: 5.7 % (ref 4.6–6.5)

## 2021-06-13 LAB — TSH: TSH: 1.2 u[IU]/mL (ref 0.35–5.50)

## 2021-06-13 LAB — LIPID PANEL
Cholesterol: 178 mg/dL (ref 0–200)
HDL: 63 mg/dL (ref >39.00)
LDL Cholesterol: 103 mg/dL — ABNORMAL HIGH (ref 0–99)
NonHDL: 114.73
Total CHOL/HDL Ratio: 3
Triglycerides: 60 mg/dL (ref 0.0–149.0)
VLDL: 12 mg/dL (ref 0.0–40.0)

## 2021-06-13 LAB — VITAMIN D 25 HYDROXY (VIT D DEFICIENCY, FRACTURES): VITD: 23.02 ng/mL — ABNORMAL LOW (ref 30.00–100.00)

## 2021-06-13 MED ORDER — TRIAMCINOLONE ACETONIDE 0.1 % EX OINT
1.0000 "application " | TOPICAL_OINTMENT | Freq: Two times a day (BID) | CUTANEOUS | 1 refills | Status: DC
  Filled 2021-06-13: qty 90, 30d supply, fill #0

## 2021-06-13 NOTE — Assessment & Plan Note (Signed)
Pt's BMI now 30.09.  Encouraged healthy diet and regular exercise.  Check labs to risk stratify.  Will follow.

## 2021-06-13 NOTE — Patient Instructions (Addendum)
Follow up in 1 year or as needed We'll notify you of your lab results and make any changes if needed Continue to work on healthy diet and regular exercise- you can do it! We'll call you to schedule your mammogram and thyroid ultrasound Call Dr Philis Pique to scheduled your pap Call any of the counselors noted to schedule an appt if needed Call with any questions or concerns Feel better! Happy Holidays!!!

## 2021-06-13 NOTE — Assessment & Plan Note (Signed)
Pt's PE WNL w/ exception of obesity and thyroid nodules.  Due for pap and mammo.  Order entered for mammo.  Pt to call GYN for pap.  Check labs.  Anticipatory guidance provided.

## 2021-06-13 NOTE — Progress Notes (Signed)
   Subjective:    Patient ID: Lauren Guerrero, female    DOB: Nov 10, 1975, 45 y.o.   MRN: [id number]  HPI CPE- pt is currently recovering from flu (tested + last week).  Due for mammo- will order today.  Due for pap w/ Dr Philis Pique. Declines flu shot as she is still recovering.  UTD on Tdap  Patient Care Team    Relationship Specialty Notifications Start End  Midge Minium, MD PCP - General Family Medicine  10/05/17   Bobbye Charleston, MD Consulting Physician Obstetrics and Gynecology  10/05/17     Health Maintenance  Topic Date Due   COVID-19 Vaccine (2 - Pfizer series) 04/19/2020   PAP SMEAR-Modifier  02/11/2021   INFLUENZA VACCINE  10/25/2021 (Originally 02/25/2021)   Hepatitis C Screening  04/23/2022 (Originally 12/18/1994)   TETANUS/TDAP  06/21/2028   HIV Screening  Completed   Pneumococcal Vaccine 43-40 Years old  Aged Out   HPV VACCINES  Aged Out      Review of Systems Patient reports no vision/ hearing changes, adenopathy,fever, weight change,  persistant/recurrent hoarseness , swallowing issues, chest pain, palpitations, edema, persistant/recurrent cough, hemoptysis, dyspnea (rest/exertional/paroxysmal nocturnal), gastrointestinal bleeding (melena, rectal bleeding), abdominal pain, significant heartburn, bowel changes, GU symptoms (dysuria, hematuria, incontinence), Gyn symptoms (abnormal  bleeding, pain),  syncope, focal weakness, memory loss, numbness & tingling, skin/hair/nail changes, abnormal bruising or bleeding, anxiety, or depression.   This visit occurred during the SARS-CoV-2 public health emergency.  Safety protocols were in place, including screening questions prior to the visit, additional usage of staff PPE, and extensive cleaning of exam room while observing appropriate contact time as indicated for disinfecting solutions.      Objective:   Physical Exam General Appearance:    Alert, cooperative, no distress, appears stated age  Head:    Normocephalic,  without obvious abnormality, atraumatic  Eyes:    PERRL, conjunctiva/corneas clear, EOM's intact, fundi    benign, both eyes  Ears:    Normal TM's and external ear canals, both ears  Nose:   Deferred due to COVID  Throat:   Neck:   Supple, symmetrical, trachea midline, no adenopathy;    Thyroid: diffuse nodularity  Back:     Symmetric, no curvature, ROM normal, no CVA tenderness  Lungs:     Clear to auscultation bilaterally, respirations unlabored  Chest Wall:    No tenderness or deformity   Heart:    Regular rate and rhythm, S1 and S2 normal, no murmur, rub   or gallop  Breast Exam:    Deferred to GYN  Abdomen:     Soft, non-tender, bowel sounds active all four quadrants,    no masses, no organomegaly  Genitalia:    Deferred to GYN  Rectal:    Extremities:   Extremities normal, atraumatic, no cyanosis or edema  Pulses:   2+ and symmetric all extremities  Skin:   Skin color, texture, turgor normal, no rashes or lesions  Lymph nodes:   Cervical, supraclavicular, and axillary nodes normal  Neurologic:   CNII-XII intact, normal strength, sensation and reflexes    throughout          Assessment & Plan:

## 2021-06-13 NOTE — Assessment & Plan Note (Signed)
Pt never had [place] as ordered.  Will re-order today.  Pt expressed understanding and is in agreement w/ plan.

## 2021-06-13 NOTE — Assessment & Plan Note (Signed)
Check labs and replete prn. 

## 2021-06-14 ENCOUNTER — Telehealth: Payer: Self-pay

## 2021-06-14 NOTE — Telephone Encounter (Signed)
-----   Message from Midge Minium, MD sent at 06/14/2021  7:27 AM EST ----- Labs look good w/ exception of low Vit D.  Based on this, we need to start 50,000 units weekly x12 weeks in addition to daily OTC supplement of at least 2000 units.

## 2021-06-14 NOTE — Telephone Encounter (Signed)
Lvm for patient to return my call about labs

## 2021-06-14 NOTE — Telephone Encounter (Signed)
Patient returned call about labs, she is aware of the results and what Dr Birdie Riddle advised

## 2021-06-14 NOTE — Telephone Encounter (Signed)
Pt was called to discuss lab results but was unable to contact her

## 2021-06-27 ENCOUNTER — Other Ambulatory Visit: Payer: No Typology Code available for payment source

## 2021-06-28 ENCOUNTER — Other Ambulatory Visit (HOSPITAL_COMMUNITY): Payer: Self-pay

## 2021-07-01 ENCOUNTER — Other Ambulatory Visit (HOSPITAL_COMMUNITY): Payer: Self-pay

## 2021-07-01 ENCOUNTER — Other Ambulatory Visit: Payer: Self-pay | Admitting: Family Medicine

## 2021-07-01 MED ORDER — VITAMIN D (ERGOCALCIFEROL) 1.25 MG (50000 UNIT) PO CAPS
50000.0000 [IU] | ORAL_CAPSULE | ORAL | 0 refills | Status: DC
  Filled 2021-07-01 – 2021-07-09 (×2): qty 12, 84d supply, fill #0

## 2021-07-01 NOTE — Progress Notes (Signed)
Prescription sent for pt

## 2021-07-03 ENCOUNTER — Other Ambulatory Visit: Payer: No Typology Code available for payment source

## 2021-07-09 ENCOUNTER — Other Ambulatory Visit (HOSPITAL_COMMUNITY): Payer: Self-pay

## 2021-07-17 ENCOUNTER — Ambulatory Visit: Payer: No Typology Code available for payment source

## 2021-08-15 ENCOUNTER — Ambulatory Visit (INDEPENDENT_AMBULATORY_CARE_PROVIDER_SITE_OTHER): Payer: No Typology Code available for payment source | Admitting: Family Medicine

## 2021-08-15 ENCOUNTER — Encounter: Payer: Self-pay | Admitting: Family Medicine

## 2021-08-15 VITALS — BP 126/68 | HR 106 | Temp 97.8°F | Resp 16 | Wt 179.2 lb

## 2021-08-15 DIAGNOSIS — M7989 Other specified soft tissue disorders: Secondary | ICD-10-CM

## 2021-08-15 NOTE — Patient Instructions (Signed)
Follow up as needed or as scheduled We'll call you with your Ultrasound appt This does not appear to be a lymph node but better to evaluate and be sure Call with any questions or concerns Happy New Year!!!

## 2021-08-15 NOTE — Progress Notes (Signed)
° °  Subjective:    Patient ID: Lauren Guerrero, female    DOB: 02-17-1976, 46 y.o.   MRN: [id number]  HPI Neck pain- pt reports being sick over the holidays.  After the new year got a massage and noted pain on both sides of neck.  Pain lasted for a few days and then improved.  Had a 2nd massage yesterday and has some discomfort today but not as bad as the first time.  Then on Monday noticed soft lump in R supraclavicular fossa.  No TTP.  No overlying redness   Review of Systems For ROS see HPI   This visit occurred during the SARS-CoV-2 public health emergency.  Safety protocols were in place, including screening questions prior to the visit, additional usage of staff PPE, and extensive cleaning of exam room while observing appropriate contact time as indicated for disinfecting solutions.      Objective:   Physical Exam Vitals reviewed.  Constitutional:      General: She is not in acute distress.    Appearance: Normal appearance. She is not ill-appearing.  HENT:     Head: Normocephalic and atraumatic.  Neck:     Comments: 3-4 cm soft tissue mass in R supraclavicular fossa Musculoskeletal:     Cervical back: Normal range of motion and neck supple. No rigidity.  Lymphadenopathy:     Head:     Right side of head: No submental, submandibular, tonsillar, preauricular, posterior auricular or occipital adenopathy.     Left side of head: No submental, submandibular, tonsillar, preauricular, posterior auricular or occipital adenopathy.     Cervical: No cervical adenopathy.  Skin:    General: Skin is warm and dry.     Findings: No erythema or rash.  Neurological:     General: No focal deficit present.     Mental Status: She is alert and oriented to person, place, and time.  Psychiatric:        Mood and Affect: Mood normal.        Behavior: Behavior normal.          Assessment & Plan:  Soft tissue mass- new.  More consistent w/ lipoma than LN but will get [place] to assess.  No  palpable LAD on exam today.  Reviewed red flags that should prompt return.  Pt expressed understanding and is in agreement w/ plan.

## 2021-08-16 ENCOUNTER — Ambulatory Visit
Admission: RE | Admit: 2021-08-16 | Discharge: 2021-08-16 | Disposition: A | Discharge status: Home / Self Care | Payer: No Typology Code available for payment source | Source: Ambulatory Visit | Attending: Family Medicine | Admitting: Family Medicine

## 2021-08-16 ENCOUNTER — Other Ambulatory Visit: Payer: Self-pay

## 2021-08-16 DIAGNOSIS — Z1231 Encounter for screening mammogram for malignant neoplasm of breast: Secondary | ICD-10-CM

## 2021-08-21 ENCOUNTER — Ambulatory Visit
Admission: RE | Admit: 2021-08-21 | Discharge: 2021-08-21 | Disposition: A | Discharge status: Home / Self Care | Payer: No Typology Code available for payment source | Source: Ambulatory Visit | Attending: Family Medicine | Admitting: Family Medicine

## 2021-08-21 ENCOUNTER — Other Ambulatory Visit: Payer: Self-pay | Admitting: Family Medicine

## 2021-08-21 DIAGNOSIS — M7989 Other specified soft tissue disorders: Secondary | ICD-10-CM

## 2021-08-22 ENCOUNTER — Telehealth: Payer: Self-pay

## 2021-08-22 NOTE — Telephone Encounter (Signed)
Patient aware of results and referral for biopsy

## 2021-08-22 NOTE — Telephone Encounter (Signed)
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  They did see a 3.5cm L sided thyroid nodule that meets criteria for biopsy.  Based on this, we are going to refer you to ENT for biopsy and appropriate follow up (dx thyroid nodule).  You should hear from them soon.

## 2021-09-07 ENCOUNTER — Emergency Department (HOSPITAL_BASED_OUTPATIENT_CLINIC_OR_DEPARTMENT_OTHER): Payer: No Typology Code available for payment source

## 2021-09-07 ENCOUNTER — Other Ambulatory Visit: Payer: Self-pay

## 2021-09-07 ENCOUNTER — Emergency Department (HOSPITAL_BASED_OUTPATIENT_CLINIC_OR_DEPARTMENT_OTHER)
Admission: EM | Admit: 2021-09-07 | Discharge: 2021-09-07 | Disposition: A | Discharge status: Home / Self Care | Payer: No Typology Code available for payment source | Attending: Emergency Medicine | Admitting: Emergency Medicine

## 2021-09-07 ENCOUNTER — Encounter (HOSPITAL_BASED_OUTPATIENT_CLINIC_OR_DEPARTMENT_OTHER): Payer: Self-pay | Admitting: Emergency Medicine

## 2021-09-07 DIAGNOSIS — R202 Paresthesia of skin: Secondary | ICD-10-CM | POA: Diagnosis not present

## 2021-09-07 DIAGNOSIS — F419 Anxiety disorder, unspecified: Secondary | ICD-10-CM | POA: Diagnosis not present

## 2021-09-07 DIAGNOSIS — Z91010 Allergy to peanuts: Secondary | ICD-10-CM | POA: Insufficient documentation

## 2021-09-07 DIAGNOSIS — Z20822 Contact with and (suspected) exposure to covid-19: Secondary | ICD-10-CM | POA: Insufficient documentation

## 2021-09-07 DIAGNOSIS — R000 Tachycardia, unspecified: Secondary | ICD-10-CM | POA: Diagnosis not present

## 2021-09-07 DIAGNOSIS — N9489 Other specified conditions associated with female genital organs and menstrual cycle: Secondary | ICD-10-CM | POA: Insufficient documentation

## 2021-09-07 LAB — TROPONIN I (HIGH SENSITIVITY): Troponin I (High Sensitivity): 2 ng/L (ref <18)

## 2021-09-07 LAB — COMPREHENSIVE METABOLIC PANEL
ALT: 10 U/L (ref 0–44)
AST: 16 U/L (ref 15–41)
Albumin: 4.1 g/dL (ref 3.5–5.0)
Alkaline Phosphatase: 48 U/L (ref 38–126)
Anion gap: 8 (ref 5–15)
BUN: 14 mg/dL (ref 6–20)
CO2: 27 mmol/L (ref 22–32)
Calcium: 9.1 mg/dL (ref 8.9–10.3)
Chloride: 103 mmol/L (ref 98–111)
Creatinine, Ser: 0.73 mg/dL (ref 0.44–1.00)
GFR, Estimated: >60 mL/min (ref >60)
Glucose, Bld: 118 mg/dL — ABNORMAL HIGH (ref 70–99)
Potassium: 3.3 mmol/L — ABNORMAL LOW (ref 3.5–5.1)
Sodium: 138 mmol/L (ref 135–145)
Total Bilirubin: 0.5 mg/dL (ref 0.3–1.2)
Total Protein: 6.8 g/dL (ref 6.5–8.1)

## 2021-09-07 LAB — URINALYSIS, ROUTINE W REFLEX MICROSCOPIC
Bilirubin Urine: NEGATIVE
Glucose, UA: NEGATIVE mg/dL
Hgb urine dipstick: NEGATIVE
Ketones, ur: NEGATIVE mg/dL
Nitrite: NEGATIVE
Protein, ur: NEGATIVE mg/dL
Specific Gravity, Urine: 1.005 (ref 1.005–1.030)
pH: 6.5 (ref 5.0–8.0)

## 2021-09-07 LAB — TSH: TSH: 1.503 u[IU]/mL (ref 0.350–4.500)

## 2021-09-07 LAB — CBC WITH DIFFERENTIAL/PLATELET
Abs Immature Granulocytes: 0.01 10*3/uL (ref 0.00–0.07)
Basophils Absolute: 0 10*3/uL (ref 0.0–0.1)
Basophils Relative: 1 %
Eosinophils Absolute: 0.1 10*3/uL (ref 0.0–0.5)
Eosinophils Relative: 3 %
HCT: 34.3 % — ABNORMAL LOW (ref 36.0–46.0)
Hemoglobin: 11.4 g/dL — ABNORMAL LOW (ref 12.0–15.0)
Immature Granulocytes: 0 %
Lymphocytes Relative: 38 %
Lymphs Abs: 1.6 10*3/uL (ref 0.7–4.0)
MCH: 28.1 pg (ref 26.0–34.0)
MCHC: 33.2 g/dL (ref 30.0–36.0)
MCV: 84.5 fL (ref 80.0–100.0)
Monocytes Absolute: 0.3 10*3/uL (ref 0.1–1.0)
Monocytes Relative: 7 %
Neutro Abs: 2.1 10*3/uL (ref 1.7–7.7)
Neutrophils Relative %: 51 %
Platelets: 290 10*3/uL (ref 150–400)
RBC: 4.06 MIL/uL (ref 3.87–5.11)
RDW: 13.2 % (ref 11.5–15.5)
WBC: 4.1 10*3/uL (ref 4.0–10.5)
nRBC: 0 % (ref 0.0–0.2)

## 2021-09-07 LAB — RESP PANEL BY RT-PCR (FLU A&B, COVID) ARPGX2
Influenza A by PCR: NEGATIVE
Influenza B by PCR: NEGATIVE
SARS Coronavirus 2 by RT PCR: NEGATIVE

## 2021-09-07 LAB — MAGNESIUM: Magnesium: 2 mg/dL (ref 1.7–2.4)

## 2021-09-07 LAB — HCG, SERUM, QUALITATIVE: Preg, Serum: NEGATIVE

## 2021-09-07 LAB — CK: Total CK: 161 U/L (ref 38–234)

## 2021-09-07 LAB — T4, FREE: Free T4: 0.87 ng/dL (ref 0.61–1.12)

## 2021-09-07 MED ORDER — SODIUM CHLORIDE 0.9 % IV BOLUS
1000.0000 mL | Freq: Once | INTRAVENOUS | Status: AC
  Administered 2021-09-07: 1000 mL via INTRAVENOUS

## 2021-09-07 MED ORDER — ACETAMINOPHEN 325 MG PO TABS
650.0000 mg | ORAL_TABLET | Freq: Once | ORAL | Status: DC
  Filled 2021-09-07: qty 2

## 2021-09-07 MED ORDER — POTASSIUM CHLORIDE CRYS ER 20 MEQ PO TBCR
40.0000 meq | EXTENDED_RELEASE_TABLET | Freq: Once | ORAL | Status: AC
  Administered 2021-09-07: 40 meq via ORAL
  Filled 2021-09-07: qty 2

## 2021-09-07 NOTE — ED Provider Notes (Signed)
°  Physical Exam  BP 108/64    Pulse 87    Temp 98 F (36.7 C) (Oral)    Resp (!) 21    Ht 5' 5.5" (1.664 m)    Wt 80.7 kg    LMP 08/26/2021 (Approximate)    SpO2 100%    BMI 29.17 kg/m   Physical Exam  Procedures  Procedures  ED Course / MDM   Clinical Course as of 09/07/21 0858  Sat Sep 07, 2021  0612 [facility] SCORE is low, pt w/ tachycardia that she attributes to anxiety, no DIB, no CP, no LE swelling. My suspicion for PE is very low.  [SG]    Clinical Course User Index [SG] Jeanell Sparrow, DO   Medical Decision Making Amount and/or Complexity of Data Reviewed Labs: ordered. Radiology: ordered.  Risk OTC drugs. Prescription drug management.   Received care of patient at 7 AM from Dr. Pearline Cables.  Please see his note for prior history, physical and care.  Briefly is a 46 year old female who presents with concern for paresthesias in her bilateral arms and legs.    At time of my evaluation, reports that she has returned to baseline.  Labs were individually reviewed and interpreted by me and show mild anemia, mild hypokalemia, no other significant electrolyte abnormalities to account for her symptoms.  She did describe an episode of very intense right eyelid spasm earlier in the week, as well as bilateral paresthesias becoming left-sided prior to their dissipation, and ordered screening head CT to evaluate for abnormalities.  CT head evaluated by me and shows no acute abnormalities.  Added on a troponin to ensure that bilateral arm paresthesias were not cardiac in nature which was within normal limits.  Unclear etiology of paresthesias.  She has normal bilateral upper and lower extremity pulses, normal reflexes on my exam, and description of distribution and improvement is not consistent with Guillain-Barr, CVA.  Consider MS.   Discussed the possibility of transfer for MRI, however given improvement of symptoms and low suspicion for emergent pathology at this time, feel outpatient neurology  follow-up and strict return precautions is appropriate.  Patient and family in agreement.  COVID/flu/TSH pending at time of discharge. Patient discharged in stable condition with understanding of reasons to return.        Gareth Morgan, MD 09/07/21 [phone]

## 2021-09-07 NOTE — Discharge Instructions (Addendum)
It was a pleasure caring for you! Your COVID/flu and thyroid tests are pending-these results can be checked online and discussed with your primary doctor. You have been given a number for Neurology follow up and we recommend calling on Monday to make an appointment. (Urine returned and does not show UTI)

## 2021-09-07 NOTE — ED Triage Notes (Signed)
°  Patient comes in with tingling and burning in bilateral arms and legs.  Patient denies any recent injury.  Patient states it woke her up from sleep around 0400 this morning.  No headache.  Pain 5/10, burning/fiery.

## 2021-09-07 NOTE — ED Provider Notes (Addendum)
[facility] EMERGENCY DEPT Provider Note   CSN: [id number] Arrival date & time: 09/07/21  3149     History  Chief Complaint  Patient presents with   Tingling    Lauren Guerrero is a 46 y.o. female.  This is a 46 y.o. female  with significant medical history as below, including anemia, fibroid, thyroid abnormality who presents to the ED with complaint of tingling, burning of extremities.  Onset of symptoms approximately 4 AM this morning.  Initially began in her bilateral upper extremities and progressed to her lower extremities.  Since onset symptoms have gradually improved.  She feels sensation of burning, tingling diffusely.  Slightly worse in LLE > RLE. Has not experienced this in the past.  No associated trauma.  No recent medication changes.  No diarrhea or vomiting.  No changes to urination.  No headaches, numbness.  No diplopia.  No neck stiffness.  No recent medication or diet changes.  No recent travel, no fevers or chills.  No identified alleviating or exacerbating factors.  Patient was noted to be tachycardic, does report that she is anxious.  Will occasionally have elevated heart rate during anxiety provoking situations.  No chest pain.   Past Medical History: No date: Anemia No date: Fibroid No date: Medical history non-contributory  Past Surgical History: No date: NO PAST SURGERIES No date: WISDOM TOOTH EXTRACTION    The history is provided by the patient. No language interpreter was used.      Home Medications Prior to Admission medications   Medication Sig Start Date End Date Taking? Authorizing Provider  Multiple Vitamins-Minerals (MULTIVITAMIN ADULT PO) Take by mouth.    [provider]  triamcinolone ointment (KENALOG) 0.1 % Apply 1 application topically 2 (two) times daily. Patient not taking: Reported on 08/15/2021 06/13/21 06/13/22  Midge Minium, MD  Vitamin D, Ergocalciferol, (DRISDOL) 1.25 MG (50000 UNIT) CAPS capsule Take  1 capsule by mouth every 7 days. 07/01/21   Midge Minium, MD      Allergies    Peanut-containing drug products, Penicillins, and Sulfa antibiotics    Review of Systems   Review of Systems  Constitutional:  Negative for chills and fever.  HENT:  Negative for facial swelling and trouble swallowing.   Eyes:  Negative for photophobia and visual disturbance.  Respiratory:  Negative for cough and shortness of breath.   Cardiovascular:  Negative for chest pain and palpitations.  Gastrointestinal:  Negative for abdominal pain, nausea and vomiting.  Endocrine: Negative for polydipsia and polyuria.  Genitourinary:  Negative for difficulty urinating and hematuria.  Musculoskeletal:  Negative for gait problem and joint swelling.  Skin:  Negative for pallor and rash.  Neurological:  Negative for syncope and headaches.       Tingling BLUE BLLE  Psychiatric/Behavioral:  Negative for agitation and confusion. The patient is nervous/anxious.    Physical Exam Updated Vital Signs BP 108/64    Pulse 87    Temp 98 F (36.7 C) (Oral)    Resp (!) 21    Ht 5' 5.5" (1.664 m)    Wt 80.7 kg    LMP 08/26/2021 (Approximate)    SpO2 100%    BMI 29.17 kg/m  Physical Exam Vitals and nursing note reviewed.  Constitutional:      General: She is not in acute distress.    Appearance: Normal appearance. She is normal weight. She is not ill-appearing, toxic-appearing or diaphoretic.  HENT:     Head: Normocephalic and atraumatic.  Jaw: No trismus.     Comments: Apparent birthmark just inferior to left orbit    Right Ear: External ear normal.     Left Ear: External ear normal.     Nose: Nose normal.     Mouth/Throat:     Mouth: Mucous membranes are moist.  Eyes:     General: No scleral icterus.       Right eye: No discharge.        Left eye: No discharge.     Extraocular Movements: Extraocular movements intact.     Conjunctiva/sclera: Conjunctivae normal.     Pupils: Pupils are equal, round, and  reactive to light.  Cardiovascular:     Rate and Rhythm: Regular rhythm. Tachycardia present.     Pulses: Normal pulses.          Radial pulses are 2+ on the right side and 2+ on the left side.       Posterior tibial pulses are 2+ on the right side and 2+ on the left side.     Heart sounds: Normal heart sounds. No murmur heard. Pulmonary:     Effort: Pulmonary effort is normal. No respiratory distress.     Breath sounds: Normal breath sounds.  Abdominal:     General: Abdomen is flat.     Palpations: Abdomen is soft.     Tenderness: There is no abdominal tenderness.  Musculoskeletal:        General: Normal range of motion.     Cervical back: Normal range of motion.     Right lower leg: No edema.     Left lower leg: No edema.  Skin:    General: Skin is warm and dry.     Capillary Refill: Capillary refill takes less than 2 seconds.  Neurological:     Mental Status: She is alert and oriented to person, place, and time.     GCS: GCS eye subscore is 4. GCS verbal subscore is 5. GCS motor subscore is 6.     Cranial Nerves: Cranial nerves 2-12 are intact. No cranial nerve deficit, dysarthria or facial asymmetry.     Sensory: Sensation is intact.     Motor: Motor function is intact. No tremor or pronator drift.     Coordination: Coordination is intact. Finger-Nose-Finger Test normal.     Gait: Gait is intact.     Comments: Strength symmetric to upper and lower extremities   Psychiatric:        Attention and Perception: Attention normal.        Mood and Affect: Mood is anxious.        Behavior: Behavior normal. Behavior is cooperative.    ED Results / Procedures / Treatments   Labs (all labs ordered are listed, but only abnormal results are displayed) Labs Reviewed  CBC WITH DIFFERENTIAL/PLATELET - Abnormal; Notable for the following components:      Result Value   Hemoglobin 11.4 (*)    HCT 34.3 (*)    All other components within normal limits  COMPREHENSIVE METABOLIC PANEL -  Abnormal; Notable for the following components:   Potassium 3.3 (*)    Glucose, Bld 118 (*)    All other components within normal limits  MAGNESIUM  CK  HCG, SERUM, QUALITATIVE  URINALYSIS, ROUTINE W REFLEX MICROSCOPIC  TSH  T4, FREE  T3, FREE  TROPONIN I (HIGH SENSITIVITY)    EKG EKG Interpretation  Date/Time:  Saturday September 07 2021 06:02:04 EST Ventricular Rate:  95 PR  Interval:  174 QRS Duration: 102 QT Interval:  381 QTC Calculation: 479 R Axis:   50 Text Interpretation: Sinus rhythm No old tracing to compare Confirmed by Wynona Dove ([id number]) on 09/07/2021 6:08:42 AM  Radiology No results found.  Procedures Procedures    Medications Ordered in ED Medications  acetaminophen (TYLENOL) tablet 650 mg (650 mg Oral Not Given 09/07/21 0620)  sodium chloride 0.9 % bolus 1,000 mL (0 mLs Intravenous Stopped 09/07/21 0732)  potassium chloride SA (KLOR-CON M) CR tablet 40 mEq (40 mEq Oral Given 09/07/21 0733)    ED Course/ Medical Decision Making/ A&P Clinical Course as of 09/07/21 0815  Sat Sep 07, 2021  0612 [facility] SCORE is low, pt w/ tachycardia that she attributes to anxiety, no DIB, no CP, no LE swelling. My suspicion for PE is very low.  [SG]    Clinical Course User Index [SG] Jeanell Sparrow, DO                           Medical Decision Making Amount and/or Complexity of Data Reviewed Labs: ordered. Radiology: ordered.  Risk OTC drugs. Prescription drug management.    CC: paresthesia/tingling/burning  This patient presents to the Emergency Department for the above complaint. This involves an extensive number of treatment options and is a complaint that carries with it a high risk of complications and morbidity. Vital signs were reviewed. Serious etiologies considered.  Record review:  Previous records obtained and reviewed   Additional history obtained from spouse  Medical and surgical history as noted above.   Work up as above, notable for:  Labs  & imaging results that were available during my care of the patient were reviewed by me and considered in my medical decision making.   Cardiac monitoring reviewed and interpreted personally which shows sinus tachycardia  Social determinants of health include - N/a  Management: IVF, APAP  Reassessment:   Given non-focal neuro exam my suspicion for CVA is very low. There is no weakness, no numbness. Speaking clearly and in full sentences. Pt does report that 5-6 days ago she had a "twitching" to her right eye but has not re-occurred. No diplopia, nausea, vomiting. No falls, no gait disturbance. I do not see any daily medications that could potentially cause this abnormality. No illicit drug use or recent ETOH use. Pt was initially tachycardic but this has since resolved. The etiology of her symptoms today is unclear.   Pt continues to report improvement in her symptoms, HR has improved. She is tolerating PO. Remainder of lab evaluation is still pending. Pt may require spine imaging if labs do not reveal likely etiology of her complaints today. Care signed out to Dr Billy Fischer at shift change. Pt is HDS currently.        This chart was dictated using voice recognition software.  Despite best efforts to proofread,  errors can occur which can change the documentation meaning.         Final Clinical Impression(s) / ED Diagnoses Final diagnoses:  Paresthesia    Rx / DC Orders ED Discharge Orders     None         Jeanell Sparrow, DO 09/07/21 [place], [place], DO 09/07/21 [phone]

## 2021-09-09 LAB — T3, FREE: T3, Free: 2.7 pg/mL (ref 2.0–4.4)

## 2021-09-17 ENCOUNTER — Encounter: Payer: Self-pay | Admitting: Registered Nurse

## 2021-09-17 ENCOUNTER — Ambulatory Visit (INDEPENDENT_AMBULATORY_CARE_PROVIDER_SITE_OTHER): Payer: No Typology Code available for payment source | Admitting: Registered Nurse

## 2021-09-17 ENCOUNTER — Other Ambulatory Visit: Payer: Self-pay

## 2021-09-17 VITALS — BP 120/59 | HR 100 | Temp 98.2°F | Resp 18 | Ht 65.5 in | Wt 175.2 lb

## 2021-09-17 DIAGNOSIS — R202 Paresthesia of skin: Secondary | ICD-10-CM | POA: Diagnosis not present

## 2021-09-17 DIAGNOSIS — E079 Disorder of thyroid, unspecified: Secondary | ICD-10-CM | POA: Diagnosis not present

## 2021-09-17 LAB — COMPREHENSIVE METABOLIC PANEL
ALT: 15 U/L (ref 0–35)
AST: 17 U/L (ref 0–37)
Albumin: 4.7 g/dL (ref 3.5–5.2)
Alkaline Phosphatase: 46 U/L (ref 39–117)
BUN: 12 mg/dL (ref 6–23)
CO2: 26 mEq/L (ref 19–32)
Calcium: 9.4 mg/dL (ref 8.4–10.5)
Chloride: 107 mEq/L (ref 96–112)
Creatinine, Ser: 0.81 mg/dL (ref 0.40–1.20)
GFR: 88.08 mL/min (ref >60.00)
Glucose, Bld: 108 mg/dL — ABNORMAL HIGH (ref 70–99)
Potassium: 3.7 mEq/L (ref 3.5–5.1)
Sodium: 138 mEq/L (ref 135–145)
Total Bilirubin: 0.5 mg/dL (ref 0.2–1.2)
Total Protein: 7.2 g/dL (ref 6.0–8.3)

## 2021-09-17 LAB — CBC WITH DIFFERENTIAL/PLATELET
Basophils Absolute: 0 10*3/uL (ref 0.0–0.1)
Basophils Relative: 0.9 % (ref 0.0–3.0)
Eosinophils Absolute: 0.1 10*3/uL (ref 0.0–0.7)
Eosinophils Relative: 1.3 % (ref 0.0–5.0)
HCT: 37.3 % (ref 36.0–46.0)
Hemoglobin: 12.5 g/dL (ref 12.0–15.0)
Lymphocytes Relative: 33.6 % (ref 12.0–46.0)
Lymphs Abs: 1.6 10*3/uL (ref 0.7–4.0)
MCHC: 33.4 g/dL (ref 30.0–36.0)
MCV: 86 fl (ref 78.0–100.0)
Monocytes Absolute: 0.3 10*3/uL (ref 0.1–1.0)
Monocytes Relative: 5.9 % (ref 3.0–12.0)
Neutro Abs: 2.8 10*3/uL (ref 1.4–7.7)
Neutrophils Relative %: 58.3 % (ref 43.0–77.0)
Platelets: 271 10*3/uL (ref 150.0–400.0)
RBC: 4.34 Mil/uL (ref 3.87–5.11)
RDW: 14.1 % (ref 11.5–15.5)
WBC: 4.8 10*3/uL (ref 4.0–10.5)

## 2021-09-17 LAB — B12 AND FOLATE PANEL
Folate: >24.2 ng/mL (ref >5.9)
Vitamin B-12: 573 pg/mL (ref 211–911)

## 2021-09-17 LAB — VITAMIN D 25 HYDROXY (VIT D DEFICIENCY, FRACTURES): VITD: 38.88 ng/mL (ref 30.00–100.00)

## 2021-09-17 NOTE — Patient Instructions (Addendum)
Ms. Lauren Guerrero to meet you.  Let's check some labs and see what's shown.  I will order MRI brain wwo contrast to help start neuro work up. I'll have a neuro office give you a call to est care in the next couple of weeks.  I will keep Dr. Birdie Riddle in the loop. I'll ask her about the biopsy  Thank you  [name]     If you have lab work done today you will be contacted with your lab results within the next 2 weeks.  If you have not heard from [place] then please contact us. The fastest way to get your results is to register for My Chart.   IF you received an x-ray today, you will receive an invoice from [facility]. Please contact [facility] at [phone] with questions or concerns regarding your invoice.   IF you received labwork today, you will receive an invoice from [facility]. Please contact [facility] at [phone] with questions or concerns regarding your invoice.   Our billing staff will not be able to assist you with questions regarding bills from these companies.  You will be contacted with the lab results as soon as they are available. The fastest way to get your results is to activate your My Chart account. Instructions are located on the last page of this paperwork. If you have not heard from [place] regarding the results in 2 weeks, please contact this office.

## 2021-09-17 NOTE — Progress Notes (Signed)
Established Patient Office Visit  Subjective:  Patient ID: Lauren Guerrero, female    DOB: 07-11-1976  Age: 46 y.o. MRN: [id number]  CC:  Chief Complaint  Patient presents with   Hospitalization Follow-up    Patient states she is here for hospital follow up for pins and needles in arms and legs.    HPI Lauren Guerrero presents for HFU  Presented to ER on 09/07/21 with pins and needles paresthesia  Started in upper extremities, moved to lower extremities.  Improved in next few hours At ER had unremarkable head CT, mild hypokalemia to 3.3, mild anemia.  Has noted intermittent symptoms throughout the next 10 days.  No clear patterns. Strength seems intact.   Otherwise no acute changes or patterns noted.   Past Medical History:  Diagnosis Date   Anemia    Fibroid    Medical history non-contributory     Past Surgical History:  Procedure Laterality Date   NO PAST SURGERIES     WISDOM TOOTH EXTRACTION      Family History  Problem Relation Age of Onset   Thyroid disease Mother     Social History   Socioeconomic History   Marital status: Married    Spouse name: Not on file   Number of children: Not on file   Years of education: Not on file   Highest education level: Not on file  Occupational History   Not on file  Tobacco Use   Smoking status: Never   Smokeless tobacco: Never  Vaping Use   Vaping Use: Never used  Substance and Sexual Activity   Alcohol use: Yes    Comment: socially   Drug use: No   Sexual activity: Yes  Other Topics Concern   Not on file  Social History Narrative   Not on file   Social Determinants of Health   Financial Resource Strain: Not on file  Food Insecurity: Not on file  Transportation Needs: Not on file  Physical Activity: Not on file  Stress: Not on file  Social Connections: Not on file  Intimate Partner Violence: Not on file    Outpatient Medications Prior to Visit  Medication Sig Dispense Refill   Multiple  Vitamins-Minerals (MULTIVITAMIN ADULT PO) Take by mouth.     Vitamin D, Ergocalciferol, (DRISDOL) 1.25 MG (50000 UNIT) CAPS capsule Take 1 capsule by mouth every 7 days. 12 capsule 0   triamcinolone ointment (KENALOG) 0.1 % Apply 1 application topically 2 (two) times daily. (Patient not taking: Reported on 08/15/2021) 90 g 1   No facility-administered medications prior to visit.    Allergies  Allergen Reactions   Peanut-Containing Drug Products Anaphylaxis and Hives   Penicillins Hives    Did it involve swelling of the face/tongue/throat, SOB, or low BP? No Did it involve sudden or severe rash/hives, skin peeling, or any reaction on the inside of your mouth or nose? No Did you need to seek medical attention at a hospital or doctor's office? No When did it last happen?       If all above answers are NO, may proceed with cephalosporin use.   Sulfa Antibiotics Hives    ROS Review of Systems  Constitutional: Negative.   HENT: Negative.    Eyes: Negative.   Respiratory: Negative.    Cardiovascular: Negative.   Gastrointestinal: Negative.   Endocrine: Negative.   Genitourinary: Negative.   Musculoskeletal: Negative.   Skin: Negative.   Allergic/Immunologic: Negative.   Neurological:  Positive for numbness.  Hematological: Negative.   Psychiatric/Behavioral: Negative.    All other systems reviewed and are negative.    Objective:    Physical Exam Vitals and nursing note reviewed.  Constitutional:      General: She is not in acute distress.    Appearance: Normal appearance. She is normal weight. She is not ill-appearing, toxic-appearing or diaphoretic.  Cardiovascular:     Rate and Rhythm: Normal rate and regular rhythm.     Heart sounds: Normal heart sounds. No murmur heard.   No friction rub. No gallop.  Pulmonary:     Effort: Pulmonary effort is normal. No respiratory distress.     Breath sounds: Normal breath sounds. No stridor. No wheezing, rhonchi or rales.  Chest:      Chest wall: No tenderness.  Skin:    General: Skin is warm and dry.  Neurological:     General: No focal deficit present.     Mental Status: She is alert and oriented to person, place, and time. Mental status is at baseline.  Psychiatric:        Mood and Affect: Mood normal.        Behavior: Behavior normal.        Thought Content: Thought content normal.        Judgment: Judgment normal.    BP (!) 120/59    Pulse 100    Temp 98.2 F (36.8 C) (Temporal)    Resp 18    Ht 5' 5.5" (1.664 m)    Wt 175 lb 3.2 oz (79.5 kg)    LMP 08/26/2021 (Approximate)    SpO2 100%    BMI 28.71 kg/m  Wt Readings from Last 3 Encounters:  09/17/21 175 lb 3.2 oz (79.5 kg)  09/07/21 178 lb (80.7 kg)  08/15/21 179 lb 3.2 oz (81.3 kg)     Health Maintenance Due  Topic Date Due   COVID-19 Vaccine (2 - Pfizer series) 04/19/2020   PAP SMEAR-Modifier  02/11/2021    There are no preventive care reminders to display for this patient.  Lab Results  Component Value Date   TSH 1.503 09/07/2021   Lab Results  Component Value Date   WBC 4.1 09/07/2021   HGB 11.4 (L) 09/07/2021   HCT 34.3 (L) 09/07/2021   MCV 84.5 09/07/2021   PLT 290 09/07/2021   Lab Results  Component Value Date   NA 138 09/07/2021   K 3.3 (L) 09/07/2021   CO2 27 09/07/2021   GLUCOSE 118 (H) 09/07/2021   BUN 14 09/07/2021   CREATININE 0.73 09/07/2021   BILITOT 0.5 09/07/2021   ALKPHOS 48 09/07/2021   AST 16 09/07/2021   ALT 10 09/07/2021   PROT 6.8 09/07/2021   ALBUMIN 4.1 09/07/2021   CALCIUM 9.1 09/07/2021   ANIONGAP 8 09/07/2021   GFR 95.26 06/13/2021   Lab Results  Component Value Date   CHOL 178 06/13/2021   Lab Results  Component Value Date   HDL 63.00 06/13/2021   Lab Results  Component Value Date   LDLCALC 103 (H) 06/13/2021   Lab Results  Component Value Date   TRIG 60.0 06/13/2021   Lab Results  Component Value Date   CHOLHDL 3 06/13/2021   Lab Results  Component Value Date   HGBA1C 5.7  06/13/2021      Assessment & Plan:   Problem List Items Addressed This Visit   None Visit Diagnoses     Paresthesia    -  Primary   Relevant  Orders   B12 and Folate Panel   CBC with Differential/Platelet   Pathologist smear review   Comprehensive metabolic panel   Iron, TIBC and Ferritin Panel   Vitamin D (25 hydroxy)   MR Brain W Wo Contrast   Pins and needles sensation       Relevant Orders   B12 and Folate Panel   CBC with Differential/Platelet   Pathologist smear review   Comprehensive metabolic panel   Iron, TIBC and Ferritin Panel   Vitamin D (25 hydroxy)   MR Brain W Wo Contrast   Thyroid mass       Relevant Orders   Ambulatory referral to ENT       No orders of the defined types were placed in this encounter.   Follow-up: Return if symptoms worsen or fail to improve.   PLAN Labs collected. Will follow up with the patient as warranted. Unclear etiology. Will pursue brain MRI to follow up on no acute findings on CT. Refer to neuro for further work up. Patient encouraged to call clinic with any questions, comments, or concerns.  Maximiano Coss, NP

## 2021-09-18 LAB — IRON,TIBC AND FERRITIN PANEL
%SAT: 22 % (calc) (ref 16–45)
Ferritin: 11 ng/mL — ABNORMAL LOW (ref 16–232)
Iron: 73 ug/dL (ref 40–190)
TIBC: 338 mcg/dL (calc) (ref 250–450)

## 2021-09-18 LAB — PATHOLOGIST SMEAR REVIEW

## 2021-09-20 ENCOUNTER — Other Ambulatory Visit (HOSPITAL_COMMUNITY): Payer: Self-pay

## 2021-09-27 ENCOUNTER — Telehealth: Payer: Self-pay | Admitting: Family Medicine

## 2021-09-27 NOTE — Telephone Encounter (Signed)
Colon cancer screening has been approved to start at age 46.  We can't order the Cologuard until after her 46th birthday. ?

## 2021-09-27 NOTE — Telephone Encounter (Signed)
Pt called about her first colonoscopy and wanted to know if she can get a prescription for cologuard to test at home before the procedure. ?She left a # [phone] ?Please advice ? ?Thank You ?

## 2021-09-27 NOTE — Telephone Encounter (Signed)
Pt called about her first colonoscopy and wanted to know if she can get a prescription for cologuard to test at home before the procedure. ?She left a # [phone] ?Please advice ?  ?Thank You ?

## 2021-09-27 NOTE — Telephone Encounter (Signed)
Called pt back and infromed her she expressed understanding and will follow up at a later date  ?

## 2021-09-30 ENCOUNTER — Ambulatory Visit: Payer: No Typology Code available for payment source | Admitting: Registered Nurse

## 2021-09-30 ENCOUNTER — Ambulatory Visit (INDEPENDENT_AMBULATORY_CARE_PROVIDER_SITE_OTHER): Payer: No Typology Code available for payment source | Admitting: Clinical

## 2021-09-30 ENCOUNTER — Other Ambulatory Visit: Payer: Self-pay | Admitting: Registered Nurse

## 2021-09-30 DIAGNOSIS — F419 Anxiety disorder, unspecified: Secondary | ICD-10-CM
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
## 2021-09-30 NOTE — Progress Notes (Signed)
? ? ? ? ? ? ? ? ? ? ? ? ? ? ?  Eileen Leuthe, PhD ?

## 2021-10-01 ENCOUNTER — Encounter: Payer: Self-pay | Admitting: Registered Nurse

## 2021-10-01 ENCOUNTER — Ambulatory Visit (INDEPENDENT_AMBULATORY_CARE_PROVIDER_SITE_OTHER): Payer: No Typology Code available for payment source | Admitting: Registered Nurse

## 2021-10-01 ENCOUNTER — Other Ambulatory Visit (HOSPITAL_COMMUNITY): Payer: Self-pay

## 2021-10-01 ENCOUNTER — Other Ambulatory Visit: Payer: Self-pay

## 2021-10-01 VITALS — BP 143/70 | HR 90 | Temp 98.0°F | Resp 18 | Ht 65.5 in | Wt 170.0 lb

## 2021-10-01 DIAGNOSIS — H6691 Otitis media, unspecified, right ear: Secondary | ICD-10-CM

## 2021-10-01 DIAGNOSIS — R197 Diarrhea, unspecified: Secondary | ICD-10-CM | POA: Diagnosis not present

## 2021-10-01 MED ORDER — AZITHROMYCIN 250 MG PO TABS
ORAL_TABLET | ORAL | 0 refills | Status: AC
  Filled 2021-10-01: qty 6, 5d supply, fill #0

## 2021-10-01 MED ORDER — HYDROCORTISONE-ACETIC ACID 1-2 % OT SOLN
3.0000 [drp] | Freq: Three times a day (TID) | OTIC | 0 refills | Status: DC | PRN
Start: 2021-10-01 — End: 2021-11-14
  Filled 2021-10-01: qty 10, 22d supply, fill #0

## 2021-10-01 MED ORDER — FLUTICASONE PROPIONATE 50 MCG/ACT NA SUSP
2.0000 | Freq: Every day | NASAL | 6 refills | Status: DC
Start: 2021-10-01 — End: 2022-03-22
  Filled 2021-10-01: qty 16, 30d supply, fill #0

## 2021-10-01 NOTE — Progress Notes (Signed)
? ?Established Patient Office Visit ? ?Subjective:  ?Patient ID: Lauren Guerrero, female    DOB: [birth year]  Age: 46 y.o. MRN: [id number] ? ?CC:  ?Chief Complaint  ?Patient presents with  ? Ear Fullness  ?  Patient states she has some ear fullness on the right side after a cold. Patient would like to discuss medication as well.  ? ? ?HPI ?Lauren Guerrero presents for ear fullness. ? ?R side. After viral URI ?Persistent x 2-3 weeks ?Dull ache ?No drainage.  ?Does note some dullness to hearing.  ?No other URI symptoms.  ? ?Does note some frequent BM since last week ?Loose stools. No blood or mucus in stool ?No nvd. ?Notes son had GI bug. His has resolved  ? ?Past Medical History:  ?Diagnosis Date  ? Anemia   ? Fibroid   ? Medical history non-contributory   ? ? ?Past Surgical History:  ?Procedure Laterality Date  ? NO PAST SURGERIES    ? WISDOM TOOTH EXTRACTION    ? ? ?Family History  ?Problem Relation Age of Onset  ? Thyroid disease Mother   ? ? ?Social History  ? ?Socioeconomic History  ? Marital status: Married  ?  Spouse name: Not on file  ? Number of children: Not on file  ? Years of education: Not on file  ? Highest education level: Not on file  ?Occupational History  ? Not on file  ?Tobacco Use  ? Smoking status: Never  ? Smokeless tobacco: Never  ?Vaping Use  ? Vaping Use: Never used  ?Substance and Sexual Activity  ? Alcohol use: Yes  ?  Comment: socially  ? Drug use: No  ? Sexual activity: Yes  ?Other Topics Concern  ? Not on file  ?Social History Narrative  ? Not on file  ? ?Social Determinants of Health  ? ?Financial Resource Strain: Not on file  ?Food Insecurity: Not on file  ?Transportation Needs: Not on file  ?Physical Activity: Not on file  ?Stress: Not on file  ?Social Connections: Not on file  ?Intimate Partner Violence: Not on file  ? ? ?Outpatient Medications Prior to Visit  ?Medication Sig Dispense Refill  ? Multiple Vitamins-Minerals (MULTIVITAMIN ADULT PO) Take by mouth.    ? Vitamin D,  Ergocalciferol, (DRISDOL) 1.25 MG (50000 UNIT) CAPS capsule Take 1 capsule by mouth every 7 days. 12 capsule 0  ? triamcinolone ointment (KENALOG) 0.1 % Apply 1 application topically 2 (two) times daily. (Patient not taking: Reported on 10/01/2021) 90 g 1  ? ?No facility-administered medications prior to visit.  ? ? ?Allergies  ?Allergen Reactions  ? Peanut-Containing Drug Products Anaphylaxis and Hives  ? Penicillins Hives  ?  Did it involve swelling of the face/tongue/throat, SOB, or low BP? No ?Did it involve sudden or severe rash/hives, skin peeling, or any reaction on the inside of your mouth or nose? No ?Did you need to seek medical attention at a hospital or doctor's office? No ?When did it last happen?       ?If all above answers are ?NO?, may proceed with cephalosporin use.  ? Sulfa Antibiotics Hives  ? ? ?ROS ?Review of Systems  ?Constitutional: Negative.   ?HENT:  Positive for ear pain.   ?Eyes: Negative.   ?Respiratory: Negative.    ?Cardiovascular: Negative.   ?Gastrointestinal: Negative.   ?Genitourinary: Negative.   ?Musculoskeletal: Negative.   ?Skin: Negative.   ?Neurological: Negative.   ?Psychiatric/Behavioral: Negative.    ?All other systems reviewed and  are negative. ? ?  ?Objective:  ?  ?Physical Exam ?Vitals and nursing note reviewed.  ?Constitutional:   ?   General: She is not in acute distress. ?   Appearance: Normal appearance. She is normal weight. She is not ill-appearing, toxic-appearing or diaphoretic.  ?HENT:  ?   Head: Normocephalic and atraumatic.  ?   Right Ear: Decreased hearing noted. Swelling present. A middle ear effusion is present.  ?   Left Ear: Hearing, tympanic membrane, ear canal and external ear normal.  ?Cardiovascular:  ?   Rate and Rhythm: Normal rate and regular rhythm.  ?   Heart sounds: Normal heart sounds. No murmur heard. ?  No friction rub. No gallop.  ?Pulmonary:  ?   Effort: Pulmonary effort is normal. No respiratory distress.  ?   Breath sounds: Normal breath  sounds. No stridor. No wheezing, rhonchi or rales.  ?Chest:  ?   Chest wall: No tenderness.  ?Skin: ?   General: Skin is warm and dry.  ?Neurological:  ?   General: No focal deficit present.  ?   Mental Status: She is alert and oriented to person, place, and time. Mental status is at baseline.  ?Psychiatric:     ?   Mood and Affect: Mood normal.     ?   Behavior: Behavior normal.     ?   Thought Content: Thought content normal.     ?   Judgment: Judgment normal.  ? ? ?BP (!) 143/70   Pulse 90   Temp 98 ?F (36.7 ?C) (Temporal)   Resp 18   Ht 5' 5.5" (1.664 m)   Wt 170 lb (77.1 kg)   SpO2 100%   BMI 27.86 kg/m?  ?Wt Readings from Last 3 Encounters:  ?10/01/21 170 lb (77.1 kg)  ?09/17/21 175 lb 3.2 oz (79.5 kg)  ?09/07/21 178 lb (80.7 kg)  ? ? ? ?Health Maintenance Due  ?Topic Date Due  ? COVID-19 Vaccine (2 - Pfizer series) 04/19/2020  ? PAP SMEAR-Modifier  02/11/2021  ? ? ?There are no preventive care reminders to display for this patient. ? ?Lab Results  ?Component Value Date  ? TSH 1.503 09/07/2021  ? ?Lab Results  ?Component Value Date  ? WBC 4.8 09/17/2021  ? HGB 12.5 09/17/2021  ? HCT 37.3 09/17/2021  ? MCV 86.0 09/17/2021  ? PLT 271.0 09/17/2021  ? ?Lab Results  ?Component Value Date  ? NA 138 09/17/2021  ? K 3.7 09/17/2021  ? CO2 26 09/17/2021  ? GLUCOSE 108 (H) 09/17/2021  ? BUN 12 09/17/2021  ? CREATININE 0.81 09/17/2021  ? BILITOT 0.5 09/17/2021  ? ALKPHOS 46 09/17/2021  ? AST 17 09/17/2021  ? ALT 15 09/17/2021  ? PROT 7.2 09/17/2021  ? ALBUMIN 4.7 09/17/2021  ? CALCIUM 9.4 09/17/2021  ? ANIONGAP 8 09/07/2021  ? GFR 88.08 09/17/2021  ? ?Lab Results  ?Component Value Date  ? CHOL 178 06/13/2021  ? ?Lab Results  ?Component Value Date  ? HDL 63.00 06/13/2021  ? ?Lab Results  ?Component Value Date  ? LDLCALC 103 (H) 06/13/2021  ? ?Lab Results  ?Component Value Date  ? TRIG 60.0 06/13/2021  ? ?Lab Results  ?Component Value Date  ? CHOLHDL 3 06/13/2021  ? ?Lab Results  ?Component Value Date  ? HGBA1C 5.7  06/13/2021  ? ? ?  ?Assessment & Plan:  ? ?Problem List Items Addressed This Visit   ?None ?Visit Diagnoses   ? ? Right otitis  media, unspecified otitis media type    -  Primary  ? Relevant Medications  ? fluticasone (FLONASE) 50 MCG/ACT nasal spray  ? azithromycin (ZITHROMAX) 250 MG tablet  ? acetic acid-hydrocortisone (VOSOL-HC) OTIC solution  ? Diarrhea, unspecified type      ? ?  ? ? ?Meds ordered this encounter  ?Medications  ? fluticasone (FLONASE) 50 MCG/ACT nasal spray  ?  Sig: Place 2 sprays into both nostrils daily.  ?  Dispense:  16 g  ?  Refill:  6  ?  Order Specific Question:   Supervising Provider  ?  Answer:   Carlota Raspberry, JEFFREY R [[id number]]  ? azithromycin (ZITHROMAX) 250 MG tablet  ?  Sig: Take 2 tablets on day 1, then 1 tablet daily on days 2 through 5  ?  Dispense:  6 tablet  ?  Refill:  0  ?  Order Specific Question:   Supervising Provider  ?  Answer:   Carlota Raspberry, JEFFREY R [[id number]]  ? acetic acid-hydrocortisone (VOSOL-HC) OTIC solution  ?  Sig: Place 3 drops into the right ear 3 (three) times daily as needed.  ?  Dispense:  10 mL  ?  Refill:  0  ?  Order Specific Question:   Supervising Provider  ?  Answer:   Carlota Raspberry, JEFFREY R [[id number]]  ? ? ?Follow-up: Return if symptoms worsen or fail to improve.  ? ?PLAN ?AOM R side. Will give z pack given penicillin and sulfa allergies ?Flonase and vosol-hc for symptom relief ?Recommend bland diet and hydration for diarrhea ?If symptoms worsen or fail to improve she will contact clinic ?Patient encouraged to call clinic with any questions, comments, or concerns. ? ?Maximiano Coss, NP ?

## 2021-10-01 NOTE — Patient Instructions (Addendum)
Ms. Weatherholtz - ? ?Great to see you ? ?Take z pack - finish whole course even if feeling better. ? ?Pair with flonase and ear drops for best relief. ? ?Azithromycin should help with infectious diarrhea but hydration and bland foods for now can also help ? ?If symptoms not improved by Friday, give me a call ? ?Thanks, ? ?Rich  ? ? ? ?If you have lab work done today you will be contacted with your lab results within the next 2 weeks.  If you have not heard from [place] then please contact us. The fastest way to get your results is to register for My Chart. ? ? ?IF you received an x-ray today, you will receive an invoice from [facility]. Please contact [facility] at [phone] with questions or concerns regarding your invoice.  ? ?IF you received labwork today, you will receive an invoice from [facility]. Please contact [facility] at [phone] with questions or concerns regarding your invoice.  ? ?Our billing staff will not be able to assist you with questions regarding bills from these companies. ? ?You will be contacted with the lab results as soon as they are available. The fastest way to get your results is to activate your My Chart account. Instructions are located on the last page of this paperwork. If you have not heard from [place] regarding the results in 2 weeks, please contact this office. ?  ? ? ?

## 2021-10-15 ENCOUNTER — Other Ambulatory Visit: Payer: Self-pay

## 2021-10-15 ENCOUNTER — Emergency Department (HOSPITAL_BASED_OUTPATIENT_CLINIC_OR_DEPARTMENT_OTHER)
Admission: EM | Admit: 2021-10-15 | Discharge: 2021-10-15 | Disposition: A | Discharge status: Home / Self Care | Payer: No Typology Code available for payment source | Attending: Emergency Medicine | Admitting: Emergency Medicine

## 2021-10-15 ENCOUNTER — Emergency Department (HOSPITAL_BASED_OUTPATIENT_CLINIC_OR_DEPARTMENT_OTHER): Payer: No Typology Code available for payment source

## 2021-10-15 ENCOUNTER — Ambulatory Visit: Payer: No Typology Code available for payment source | Admitting: Clinical

## 2021-10-15 DIAGNOSIS — R110 Nausea: Secondary | ICD-10-CM | POA: Diagnosis not present

## 2021-10-15 DIAGNOSIS — F4320 Adjustment disorder, unspecified: Secondary | ICD-10-CM | POA: Diagnosis not present

## 2021-10-15 DIAGNOSIS — R197 Diarrhea, unspecified: Secondary | ICD-10-CM | POA: Diagnosis not present

## 2021-10-15 DIAGNOSIS — R1032 Left lower quadrant pain: Secondary | ICD-10-CM

## 2021-10-15 DIAGNOSIS — F4321 Adjustment disorder with depressed mood: Secondary | ICD-10-CM

## 2021-10-15 LAB — CBC WITH DIFFERENTIAL/PLATELET
Abs Immature Granulocytes: 0 10*3/uL (ref 0.00–0.07)
Basophils Absolute: 0 10*3/uL (ref 0.0–0.1)
Basophils Relative: 1 %
Eosinophils Absolute: 0.1 10*3/uL (ref 0.0–0.5)
Eosinophils Relative: 1 %
HCT: 37.8 % (ref 36.0–46.0)
Hemoglobin: 12.5 g/dL (ref 12.0–15.0)
Immature Granulocytes: 0 %
Lymphocytes Relative: 27 %
Lymphs Abs: 1.6 10*3/uL (ref 0.7–4.0)
MCH: 28.3 pg (ref 26.0–34.0)
MCHC: 33.1 g/dL (ref 30.0–36.0)
MCV: 85.7 fL (ref 80.0–100.0)
Monocytes Absolute: 0.4 10*3/uL (ref 0.1–1.0)
Monocytes Relative: 6 %
Neutro Abs: 3.9 10*3/uL (ref 1.7–7.7)
Neutrophils Relative %: 65 %
Platelets: 279 10*3/uL (ref 150–400)
RBC: 4.41 MIL/uL (ref 3.87–5.11)
RDW: 13.6 % (ref 11.5–15.5)
WBC: 6.1 10*3/uL (ref 4.0–10.5)
nRBC: 0 % (ref 0.0–0.2)

## 2021-10-15 LAB — PREGNANCY, URINE: Preg Test, Ur: NEGATIVE

## 2021-10-15 LAB — URINALYSIS, ROUTINE W REFLEX MICROSCOPIC
Bilirubin Urine: NEGATIVE
Glucose, UA: NEGATIVE mg/dL
Ketones, ur: NEGATIVE mg/dL
Leukocytes,Ua: NEGATIVE
Nitrite: NEGATIVE
Protein, ur: NEGATIVE mg/dL
Specific Gravity, Urine: 1.005 (ref 1.005–1.030)
pH: 5.5 (ref 5.0–8.0)

## 2021-10-15 LAB — COMPREHENSIVE METABOLIC PANEL
ALT: 13 U/L (ref 0–44)
AST: 16 U/L (ref 15–41)
Albumin: 4.6 g/dL (ref 3.5–5.0)
Alkaline Phosphatase: 37 U/L — ABNORMAL LOW (ref 38–126)
Anion gap: 9 (ref 5–15)
BUN: 7 mg/dL (ref 6–20)
CO2: 25 mmol/L (ref 22–32)
Calcium: 9.5 mg/dL (ref 8.9–10.3)
Chloride: 106 mmol/L (ref 98–111)
Creatinine, Ser: 0.78 mg/dL (ref 0.44–1.00)
GFR, Estimated: >60 mL/min (ref >60)
Glucose, Bld: 98 mg/dL (ref 70–99)
Potassium: 3.5 mmol/L (ref 3.5–5.1)
Sodium: 140 mmol/L (ref 135–145)
Total Bilirubin: 0.5 mg/dL (ref 0.3–1.2)
Total Protein: 7.5 g/dL (ref 6.5–8.1)

## 2021-10-15 LAB — LIPASE, BLOOD: Lipase: <10 U/L — ABNORMAL LOW (ref 11–51)

## 2021-10-15 MED ORDER — SODIUM CHLORIDE 0.9 % IV BOLUS
500.0000 mL | Freq: Once | INTRAVENOUS | Status: AC
  Administered 2021-10-15: 500 mL via INTRAVENOUS

## 2021-10-15 MED ORDER — LOPERAMIDE HCL 2 MG PO CAPS
2.0000 mg | ORAL_CAPSULE | Freq: Four times a day (QID) | ORAL | 0 refills | Status: DC | PRN
Start: 2021-10-15 — End: 2021-11-14

## 2021-10-15 MED ORDER — ONDANSETRON 4 MG PO TBDP: 4.0000 mg | ORAL_TABLET | Freq: Three times a day (TID) | ORAL | 0 refills | Status: DC | PRN

## 2021-10-15 MED ORDER — IOHEXOL 300 MG/ML  SOLN
100.0000 mL | Freq: Once | INTRAMUSCULAR | Status: AC | PRN
  Administered 2021-10-15: 100 mL via INTRAVENOUS

## 2021-10-15 MED ORDER — DICYCLOMINE HCL 20 MG PO TABS: 20.0000 mg | ORAL_TABLET | Freq: Three times a day (TID) | ORAL | 0 refills | Status: DC | PRN

## 2021-10-15 NOTE — Discharge Instructions (Signed)

## 2021-10-15 NOTE — ED Provider Notes (Signed)
? ?Emergency Department Provider Note ? ? ?I have reviewed the triage vital signs and the nursing notes. ? ? ?HISTORY ? ?Chief Complaint ?Anxiety, Abdominal Pain (X1 day. ), and Diarrhea ? ? ?HPI ?Lauren Guerrero is a 46 y.o. female with past medical history reviewed below presents emergency department for evaluation of left lower quadrant abdominal pain along with diarrhea.  Symptoms of been developing over the past 24 hours.  She describes an intermittent dull aching pain in the left lower abdomen along with some loose, nonbloody diarrhea.  She has had some associated nausea but no vomiting.  No fevers or chills.  No similar pain like this in the past.  No UTI symptoms or vaginal bleeding/discharge. ? ?She notes she has been feeling increasing anxiety and stress over the recent death of a family member. Denies any thoughts of self harm.  ? ? ?Past Medical History:  ?Diagnosis Date  ? Anemia   ? Fibroid   ? Medical history non-contributory   ? ? ?Review of Systems ? ?Constitutional: No fever/chills ?Eyes: No visual changes. ?ENT: No sore throat. ?Cardiovascular: Denies chest pain. ?Respiratory: Denies shortness of breath. ?Gastrointestinal: Positive LLQ abdominal pain. Mild nausea, no vomiting.  Positive diarrhea.  No constipation. ?Genitourinary: Negative for dysuria. ?Musculoskeletal: Negative for back pain. ?Skin: Negative for rash. ?Neurological: Negative for headaches, focal weakness or numbness. ? ? ?____________________________________________ ? ? ?PHYSICAL EXAM: ? ?VITAL SIGNS: ?ED Triage Vitals  ?Enc Vitals Group  ?   BP 10/15/21 1706 124/76  ?   Pulse Rate 10/15/21 1706 89  ?   Resp 10/15/21 1706 18  ?   Temp 10/15/21 1706 98.2 ?F (36.8 ?C)  ?   Temp Source 10/15/21 1706 Oral  ?   SpO2 10/15/21 1706 100 %  ?   Weight 10/15/21 1708 168 lb (76.2 kg)  ?   Height 10/15/21 1708 '5\' 5"'$  (1.651 m)  ? ?Constitutional: Alert and oriented. Well appearing and in no acute distress. ?Eyes: Conjunctivae are normal.   ?Head: Atraumatic. ?Nose: No congestion/rhinnorhea. ?Mouth/Throat: Mucous membranes are moist.   ?Neck: No stridor.   ?Cardiovascular: Normal rate, regular rhythm. Good peripheral circulation. Grossly normal heart sounds.   ?Respiratory: Normal respiratory effort.  No retractions. Lungs CTAB. ?Gastrointestinal: Soft with mild tenderness in the LLQ. No rebound or guarding. No distention.  ?Musculoskeletal:  No gross deformities of extremities. ?Neurologic:  Normal speech and language.  ?Skin:  Skin is warm, dry and intact. No rash noted. ? ? ?____________________________________________ ?  ?LABS ?(all labs ordered are listed, but only abnormal results are displayed) ? ?Labs Reviewed  ?COMPREHENSIVE METABOLIC PANEL - Abnormal; Notable for the following components:  ?    Result Value  ? Alkaline Phosphatase 37 (*)   ? All other components within normal limits  ?LIPASE, BLOOD - Abnormal; Notable for the following components:  ? Lipase <10 (*)   ? All other components within normal limits  ?URINALYSIS, ROUTINE W REFLEX MICROSCOPIC - Abnormal; Notable for the following components:  ? Color, Urine COLORLESS (*)   ? Hgb urine dipstick SMALL (*)   ? Bacteria, UA RARE (*)   ? All other components within normal limits  ?CBC WITH DIFFERENTIAL/PLATELET  ?PREGNANCY, URINE  ? ?____________________________________________ ? ?EKG ? ? EKG Interpretation ? ?Date/Time:  Tuesday October 15 2021 18:22:13 EDT ?Ventricular Rate:  78 ?PR Interval:  156 ?QRS Duration: 102 ?QT Interval:  396 ?QTC Calculation: 452 ?R Axis:   45 ?Text Interpretation: Sinus rhythm Confirmed by  Nanda Quinton ([id number]) on 10/15/2021 6:48:38 PM ?  ? ?  ? ? ?____________________________________________ ? ?RADIOLOGY ? ?CT ABDOMEN PELVIS W CONTRAST ? ?Result Date: 10/15/2021 ?CLINICAL DATA:  LLQ abdominal pain EXAM: CT ABDOMEN AND PELVIS WITH CONTRAST TECHNIQUE: Multidetector CT imaging of the abdomen and pelvis was performed using the standard protocol following bolus  administration of intravenous contrast. RADIATION DOSE REDUCTION: This exam was performed according to the departmental dose-optimization program which includes automated exposure control, adjustment of the mA and/or kV according to patient size and/or use of iterative reconstruction technique. CONTRAST:  149m OMNIPAQUE IOHEXOL 300 MG/ML  SOLN COMPARISON:  None. FINDINGS: Lower chest: No acute abnormality. Hepatobiliary: No focal liver abnormality. No gallstones, gallbladder wall thickening, or pericholecystic fluid. No biliary dilatation. Pancreas: No focal lesion. Normal pancreatic contour. No surrounding inflammatory changes. No main pancreatic ductal dilatation. Spleen: Normal in size without focal abnormality. A splenule is noted. Adrenals/Urinary Tract: No adrenal nodule bilaterally. Bilateral kidneys enhance symmetrically. No hydronephrosis. No hydroureter. The urinary bladder is unremarkable. On delayed imaging, there is no urothelial wall thickening and there are no filling defects in the opacified portions of the bilateral collecting systems or ureters. Stomach/Bowel: Stomach is within normal limits. No evidence of bowel wall thickening or dilatation. Couple scattered colonic diverticula. Appendix appears normal. Vascular/Lymphatic: No abdominal aorta or iliac aneurysm. Mild atherosclerotic plaque of the aorta and its branches. No abdominal, pelvic, or inguinal lymphadenopathy. Reproductive: Couple of coarsely calcified lesions within the anterior uterine wall and posterior uterine wall likely representing degenerative uterine fibroids. Uterus and bilateral adnexa are unremarkable. Other: No intraperitoneal free fluid. No intraperitoneal free gas. No organized fluid collection. Musculoskeletal: No abdominal wall hernia or abnormality. No suspicious lytic or blastic osseous lesions. No acute displaced fracture. Multilevel degenerative changes of the spine. IMPRESSION: 1. Few scattered colonic  diverticulosis with no acute diverticulitis. 2. Degenerative intramural uterine fibroids. Electronically Signed   By: MIven FinnM.D.   On: 10/15/2021 21:22   ? ?____________________________________________ ? ? ?PROCEDURES ? ?Procedure(s) performed:  ? ?Procedures ? ?None ?____________________________________________ ? ? ?INITIAL IMPRESSION / ASSESSMENT AND PLAN / ED COURSE ? ?Pertinent labs & imaging results that were available during my care of the patient were reviewed by me and considered in my medical decision making (see chart for details). ?  ?This patient is Presenting for Evaluation of abdominal pain, which does require a range of treatment options, and is a complaint that involves a high risk of morbidity and mortality. ? ?The Differential Diagnoses includes but is not exclusive to ectopic pregnancy, ovarian cyst, ovarian torsion, acute appendicitis, urinary tract infection, endometriosis, bowel obstruction, hernia, colitis, renal colic, gastroenteritis, volvulus etc. ? ? ?Critical Interventions-  ?  ?Medications  ?sodium chloride 0.9 % bolus 500 mL (0 mLs Intravenous Stopped 10/15/21 2102)  ?iohexol (OMNIPAQUE) 300 MG/ML solution 100 mL (100 mLs Intravenous Contrast Given 10/15/21 2112)  ? ? ?Reassessment after intervention: Vitals remain WNL. ? ? ?I decided to review pertinent External Data, and in summary no recent ED visits for similar. ?  ?Clinical Laboratory Tests Ordered, included no leukocytosis.  Kidney function within normal limits.  LFTs and bilirubin normal. Lipase normal.  ? ?Radiologic Tests Ordered, included CT abdomen/pelvis. I independently interpreted the images and agree with radiology interpretation.  ? ?Cardiac Monitor Tracing which shows NSR. ? ? ?Social Determinants of Health Risk no smoking history.  ? ?Medical Decision Making: Summary:  ?Patient presents emergency department for evaluation of left lower quadrant abdominal pain with  diarrhea.  Plan for CT imaging given mild  tenderness on exam to rule out diverticulitis.  Lower suspicion for ovarian torsion, PID, TOA.  Patient also with some emotional distress after the loss of a family member.  Her grief appears appropriate. No SI.  ? ?Reevaluati

## 2021-10-15 NOTE — ED Triage Notes (Signed)
Patient arrives with complaints of anxiety x1 day. Patients father died yesterday and it has made her very anxious, per pt.  ? ?Patient also reports left-sided abdominal cramping/discomfort and diarrhea x1 week. Worsened yesterday. 6/10 pain.  ?

## 2021-10-17 ENCOUNTER — Other Ambulatory Visit: Payer: No Typology Code available for payment source

## 2021-10-17 ENCOUNTER — Other Ambulatory Visit (HOSPITAL_COMMUNITY): Payer: Self-pay

## 2021-10-17 MED ORDER — FLUTICASONE PROPIONATE 50 MCG/ACT NA SUSP
NASAL | 11 refills | Status: DC
  Filled 2021-10-17: qty 16, 30d supply, fill #0

## 2021-10-21 ENCOUNTER — Other Ambulatory Visit: Payer: Self-pay | Admitting: Otolaryngology

## 2021-10-21 DIAGNOSIS — E041 Nontoxic single thyroid nodule: Secondary | ICD-10-CM

## 2021-10-22 ENCOUNTER — Ambulatory Visit (INDEPENDENT_AMBULATORY_CARE_PROVIDER_SITE_OTHER): Payer: No Typology Code available for payment source | Admitting: Clinical

## 2021-10-22 DIAGNOSIS — F419 Anxiety disorder, unspecified: Secondary | ICD-10-CM | POA: Diagnosis not present

## 2021-10-22 NOTE — Progress Notes (Signed)
[facility] Counselor/Therapist Progress Note ? ?Patient ID: Lauren Guerrero, MRN: [id number]   ? ?Date: 10/22/21 ? ?Time Spent: 10:00 am - 11:00 am: 60 Minutes ? ?Type of Service Provided Individual Therapy  ?Type of Contact virtual (via Webex with real time audio and visual interaction)  ?Patient Location: home       ?Provider Location: office ? ?Lauren Guerrero participated from home, via video, and consented to treatment. Therapist participated from office. We met online due to [place] pandemic.  ? ? ?Mental Status Exam: ?Appearance:  Fairly Groomed     ?Behavior: Appropriate  ?Motor: Normal  ?Speech/Language:  Clear and Coherent  ?Affect: Appropriate but a bit sad  ?Mood: normal but a bit sad  ?Thought process: normal  ?Thought content:   WNL  ?Sensory/Perceptual disturbances:   WNL  ?Orientation: oriented to person, place, time/date, and situation  ?Attention: Good  ?Concentration: Good  ?Memory: WNL  ?Fund of knowledge:  Good  ?Insight:   Good  ?Judgment:  Good  ?Impulse Control: Good  ? ?Risk Assessment: no apparent indicators of SI/HI during the current visit ? ?Presenting Problems, Reported Symptoms, and /or Interim History: Lauren Guerrero reported that her father passed away between the last visit and this one. She is in the process of planning a funeral and will be traveling to [place] to be with the rest of her family.  ? ?Subjective: Lauren Guerrero and presented for an individual outpatient therapy session. The following was addressed during sessions.  ? ?As noted, Lauren Guerrero's father passed away unexpectedly between the last visit and this one. Lauren Guerrero reported that things have been very difficult for her. She has tried to remain strong for her children. The grieving process was discussed, and Lauren Guerrero was encouraged look for ways to support her coping resources and to work to be forgiving with herself at this time. ? ?Lauren Guerrero reported that between sessions her stress level had been 7-10 (with 10 being  very stressed). 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 The relationship between feelings-thoughts-behavior was discussed along with how thinking can influence feelings and behaviors. Concrete strategies to help with getting out of the house with her daughter were discussed (including the use of timers, visuals, and gradual changes to sleeping arrangements) but it was also discussed that this may not be the time to try to implement new strategies. Lauren Guerrero has started to take her daughter right to dance after school rather than coming home first and this has helped to decrease her stress. Communication with her spouse was briefly touched on but will be explored more at a later time. Lauren Guerrero identified several coping activities that she could engage in including reaching out to her family members that have experienced a similar loss, going to an exercise class, meditation and prayer, and going for a walk.  ? ?Interventions/Psychotherapy Techniques Used During Session: Cognitive Behavioral Therapy and Grief Therapy ? ?Diagnosis: Anxiety ? ?MENTAL HEALTH INTERVENTIONS USED DURING TREATMENT & PATIENT'S RESPONSE TO INTERVENTIONS:  ?Short-term Objective addressed today: [place] will be able to measure her anxiety and stress level within 1 month, and Lauren Guerrero will be able to describe the link between thoughts-feelings-and actions and identify the major components of her anxiety/stress within 3 months. ?Mental health techniques used: Objective was addressed in session through the use of  Cognitive Behavioral Therapy and Psycho-education/Bibliotherapy and discussion. The relationship between  thoughts-behaviors-and feelings was introduced and discussed.  She started measuring her stress/anxiety level. Lauren Guerrero's response was positive.  ?Progress Toward Goal: progressing  ? ?Short-term  Objective addressed today: Lauren Guerrero will be able to identify and make use of supports to help her process her grief and manage her emotions ?Mental health techniques used: Objective was addressed in session through the use of discussion. She was able to identify several strategies that she is using and a few more were added to the list. Lauren Guerrero's response was positive.  ?Progress Toward Goal: progressing  ?   ?PLAN  ?1. Lauren Guerrero  will return for a therapy session.   ?2. Homework Given:  Lauren Guerrero was encouraged to use her coping resources and reach out to her social network for support. She was also encouraged to contact the therapist if she needed to between sessions and was offered to add more visits if she would like. If she has the mental energy, she was asked to write down specific negative thoughts that she has related to 'having to do it all' or holding herself to high standards. This homework will be reviewed with the family at the next visit.  ?3. During the next session check in on grieving process and anxiety.   ? ? ?[name], Lauren Guerrero ?

## 2021-10-22 NOTE — Progress Notes (Signed)
Individual Treatment Plan  ? ?Lauren Guerrero ?MRD: [id number] ? ?Plan Developed: preliminary plan developed 09/30/2021 and finalized on 10/22/2021 ?Anticipated end date: 9-12 months.    ? ?Goals of therapy will be to help manage and/or decrease symptoms associated with Lauren Guerrero's diagnosis to improve daily functioning. Of note, between the intake and first therapy visit, Lauren Guerrero experienced the unexpected loss of her father. Goals related to grieving were added.  ? ?Who participated in treatment planning:  Therapist, patient ?The following goals were developed in collaboration with the patient.  ? ?Problem/Need: Anxiety - Jacquelina experiences anxiety which is exacerbated by the hectic schedule and her expectations of herself.  ? ?Long-Term Goal #1: Reduce overall level, frequency, and intensity of the feelings of anxiety evidenced by decreased anxiety symptoms and stress level per client report  ?Short-Term Objectives: ?Objective 1A: Lauren Guerrero will be able to measure her anxiety and stress level within 1 month.  ?Objective 1B: Lauren Guerrero will be able to describe the link between thoughts-feelings-and actions and identify the major components of her anxiety/stress within 3 months. ?Objective 1C: Lauren Guerrero will be able to identify negative thoughts that are contributing to or perpetuating anxiety/stress and identify at least one alternative thought within 6 months. (Verbalize an understanding of the role that distorted thinking plays in creating fears, excessive worry, and ruminations). ?Objective 1D: Develop a range of coping mechanisms to address negative thinking patterns and physiological symptoms of anxiety.  ?Interventions: Cognitive Behavioral Therapy, Motivational Interviewing, and Psycho-education/Bibliotherapy  coping skills, and other evidenced-based practices will be used to promote progress towards healthy functioning and to help manage decrease symptoms associated with their diagnosis.  ?Treatment  Regimen: Individual  skill building sessions every 2-3 weeks for addressing treatment goal/objective ?Target Date: 06/2022 ?Responsible Party: therapist and patient ?Person delivering treatment: Licensed Psychologist [name], PhD will support the patient's ability to achieve the goals identified. ? ?Problem/Need: communication challenge - Lauren Guerrero has had some challenges with communicating with her family members.  ?Long-Term Goal #2: Improve communication between [place] and her family members.  ?Short-Term Objectives: ?Objective 2A: Emogene will be able to identify the goals of her communication with her family members.  ?Objective 2B: Lauren Guerrero will be able to identify breakdowns in communication within 4 months. ?Objective 2C: Lauren Guerrero will be able to implement more effective communication with her family members within 6 months. ?Interventions: Cognitive Behavioral Therapy, Assertiveness/Communication, and Motivational Interviewing , and other evidenced-based practices will be used to promote progress towards healthy functioning and to help manage decrease symptoms associated with their diagnosis.  ?Treatment Regimen: Individual  skill building sessions every 2-3 weeks to address treatment goal/objective ?Target Date: 06/2022 ?Responsible Party: therapist and patient ?Person delivering treatment: Licensed Psychologist [name], PhD will support the patient's ability to achieve the goals identified. ? ?Problem/Need: Grief - Lauren Guerrero experienced the sudden loss of her father between the intake and initial therapy visit.  ?Long-Term Goal #3: Lauren Guerrero will be able to process her grief.  ?Short-Term Objectives: ?Objective 3A: Lauren Guerrero will be able to talk about her feelings related to grief.  ?Objective 3B: Lauren Guerrero will be able to identify and make use of supports to help her process her grief and manage her emotions.  ?Interventions: Cognitive Behavioral Therapy, Motivational Interviewing, and Grief  Therapy , and other evidenced-based practices will be used to promote progress towards healthy functioning and to help manage decrease symptoms associated with their diagnosis.  ?Treatment Regimen: Individual  skill building sessions every 2-3 weeks to address treatment goal/objective ?  Target Date: 06/2022 ?Responsible Party: therapist and patient ?Person delivering treatment: Licensed Psychologist [name], PhD will support the patient's ability to achieve the goals identified. ? ? ? ?patient participated in treatment planning: ?_X_ contributed to goals and plan ?_X_ aware of plan content ?__ reviewed written plan ?__ refused to participate ?__ unable to participate because _________________________________________ ? ? ?Progress and treatment plan will be reviewed periodically (at least every 180 days, or sooner if needed). This treatment plan was reviewed with the family on 10/22/2021. Given that session was virtual, Lauren Guerrero expressed verbally her agreement with treatment plan.  ? ? ?[name], PhD ?

## 2021-10-25 ENCOUNTER — Other Ambulatory Visit (HOSPITAL_COMMUNITY): Payer: Self-pay

## 2021-11-02 ENCOUNTER — Other Ambulatory Visit (HOSPITAL_COMMUNITY): Payer: Self-pay

## 2021-11-11 ENCOUNTER — Other Ambulatory Visit: Payer: No Typology Code available for payment source

## 2021-11-12 ENCOUNTER — Ambulatory Visit
Admission: RE | Admit: 2021-11-12 | Discharge: 2021-11-12 | Disposition: A | Discharge status: Home / Self Care | Payer: No Typology Code available for payment source | Source: Ambulatory Visit | Attending: Otolaryngology | Admitting: Otolaryngology

## 2021-11-12 ENCOUNTER — Other Ambulatory Visit (HOSPITAL_COMMUNITY)
Admission: RE | Admit: 2021-11-12 | Discharge: 2021-11-12 | Disposition: A | Discharge status: Home / Self Care | Payer: No Typology Code available for payment source | Source: Ambulatory Visit | Attending: Radiology | Admitting: Radiology

## 2021-11-12 DIAGNOSIS — E041 Nontoxic single thyroid nodule: Secondary | ICD-10-CM | POA: Diagnosis present

## 2021-11-13 ENCOUNTER — Other Ambulatory Visit: Payer: No Typology Code available for payment source

## 2021-11-13 LAB — CYTOLOGY - NON PAP

## 2021-11-14 ENCOUNTER — Ambulatory Visit (INDEPENDENT_AMBULATORY_CARE_PROVIDER_SITE_OTHER): Payer: No Typology Code available for payment source | Admitting: Family Medicine

## 2021-11-14 ENCOUNTER — Ambulatory Visit (INDEPENDENT_AMBULATORY_CARE_PROVIDER_SITE_OTHER): Payer: No Typology Code available for payment source | Admitting: Clinical

## 2021-11-14 ENCOUNTER — Encounter: Payer: Self-pay | Admitting: Family Medicine

## 2021-11-14 ENCOUNTER — Ambulatory Visit: Payer: No Typology Code available for payment source | Admitting: Family Medicine

## 2021-11-14 VITALS — BP 104/58 | HR 99 | Temp 97.5°F | Resp 16 | Wt 165.2 lb

## 2021-11-14 DIAGNOSIS — Z800 Family history of malignant neoplasm of digestive organs: Secondary | ICD-10-CM | POA: Diagnosis not present

## 2021-11-14 DIAGNOSIS — F419 Anxiety disorder, unspecified: Secondary | ICD-10-CM | POA: Diagnosis not present

## 2021-11-14 DIAGNOSIS — F4321 Adjustment disorder with depressed mood: Secondary | ICD-10-CM

## 2021-11-14 NOTE — Progress Notes (Addendum)
[facility] Counselor/Therapist Progress Note ? ?Patient ID: Lauren Guerrero, MRN: [id number]   ? ?Date: 11/14/2021 ? ?Time Spent: 11:02 am - 12:05 pm: 63 Minutes ? ?Type of Service Provided Individual Therapy  ?Type of Contact virtual (via Webex with real time audio and visual interaction)  ?Patient Location: home       ?Provider Location: office ? ?Lauren Guerrero participated from home, via video, and consented to treatment. Therapist participated from office. We met online due to [place] pandemic.  ? ? ?Mental Status Exam: ?Appearance:  Fairly Groomed     ?Behavior: Appropriate  ?Motor: Normal  ?Speech/Language:  Clear and Coherent and Normal Rate  ?Affect: Congruent  ?Mood: normal but sad at times  ?Thought process: normal  ?Thought content:   WNL  ?Sensory/Perceptual disturbances:   WNL  ?Orientation: oriented to person, place, time/date, and situation  ?Attention: Good  ?Concentration: Fair  ?Memory: WNL  ?Fund of knowledge:  Fair  ?Insight:   Fair  ?Judgment:  Good  ?Impulse Control: Good  ? ?Risk Assessment: no apparent indicators of SI or HI ? ? ?Presenting Problems, Reported Symptoms, and /or Interim History: Lauren Guerrero presented for a therapy session to address anxiety, stress, and grief.  She reported that between sessions she attended her father's funeral. ? ?Subjective: Lauren Guerrero presented for an individual outpatient therapy session. The following was addressed during sessions.  ? ?Lauren Guerrero reported that she was "hanging in".  She noted that she and her family had just returned from her father's funeral.  Although he the cause of his death was cardiac arrest they have since discovered that he also had stage IV colon cancer.  This has increased her anxiety about her health.  Lauren Guerrero reported that she was feeling unlike herself and has been struggling with sleeping and eating.  This was normalized given her grief.  She reported that she has been reaching out to her family supports and has been  trying to let some things go and not hold herself to such a high standard at this time.  She reported that strategies that help her get through the day include taking a walk, exercise, and completing easy tasks at home. ? ?Lauren Guerrero reported that she has been using a check list with her daughter to help her get through her regular routines.  Including a reinforcer to help her daughter use this tool was discussed.  Lauren Guerrero reported that her 45-year-old has also been having tantrums.  The impact of grief on the family as well as the impact of grief on Lauren Guerrero being able to engage was discussed.  Strategies that helped her replenish her coping resources identified during session included being in nature and setting up fun activities for her children that did not require high level of mental effort from her.  The strategies of deep breathing, progressive muscle relaxation, and visualization were discussed and examples were provided during session.  Lauren Guerrero was encouraged to try to use some of these tools on her own (which can be facilitated through the use of YouTube videos) to determine if she liked them.  The idea of building her coping toolbox was discussed, especially in reference to her anxiety about her own health given the new information she discovered about her parent.  Communication with her spouse was discussed. ? ?Interventions/Psychotherapy Techniques Used During Session: Cognitive Behavioral Therapy and Grief Therapy, Psychoeducational  ? ?Diagnosis: Anxiety ? ?MENTAL HEALTH INTERVENTIONS USED DURING TREATMENT & PATIENT'S RESPONSE TO INTERVENTIONS:  ?Short-term Objective addressed today:Develop  a range of coping mechanisms to address negative thinking patterns and physiological symptoms of anxiety.  ?Mental health techniques used: Objective was addressed in session through the use of Cognitive Behavioral Therapy, discussion, demonstrations, and teaching skills. [name]'s response was positive.  ?Progress  Toward Goal: Progressing -various tools that Lauren Guerrero could include in her coping toolbox were discussed and she seemed interested in several of the strategies. ? ?Short-term Objective addressed today: Lauren Guerrero will be able to talk about her feelings related to grief AND Lauren Guerrero will be able to identify and make use of supports to help her process her grief and manage her emotions.  ?Mental health techniques used: Objective was addressed in session through the use of Grief Therapy and discussion. [name]'s response was mixed but within expectations. ?Progress Toward Goal: Some progress -Lauren Guerrero is able to talk about her feelings for a short time but tends to shift into more action oriented discussions.  Therapist will continue to follow Lauren Guerrero's lead about wanting to discuss or not discuss her emotions in this regard.  She reported that she has continued to seek out emotional support. ?   ?PLAN  ?1. Lauren Guerrero will return for a therapy session.   ?2. Homework Given: Try some of the tools discussed today including deep breathing, PMR, and visualization.  Continue to try to incorporate coping activities into her daily schedule as much as possible.  Continue to provide herself space to not be functioning at such a high level. This homework will be reviewed with Lauren Guerrero at the next visit.  ?3. During the next session to check in on use of coping tools and strategies discussed for her child, check in on communication with her spouse.   ? ?Individual Treatment Plan - please see the note from 10/22/2021 for complete treatment plan information ? ?Problem/Need: Anxiety - Lauren Guerrero experiences anxiety which is exacerbated by the hectic schedule and her expectations of herself.  ? ?Long-Term Goal #1: Reduce overall level, frequency, and intensity of the feelings of anxiety evidenced by decreased anxiety symptoms and stress level per client report  ?Short-Term Objectives: ?Objective 1A: [name] will be able to measure her  anxiety and stress level within 1 month.  ?Objective 1B: Wilna will be able to describe the link between thoughts-feelings-and actions and identify the major components of her anxiety/stress within 3 months. ?Objective 1C: Shauntell will be able to identify negative thoughts that are contributing to or perpetuating anxiety/stress and identify at least one alternative thought within 6 months. (Verbalize an understanding of the role that distorted thinking plays in creating fears, excessive worry, and ruminations). ?Objective 1D: Develop a range of coping mechanisms to address negative thinking patterns and physiological symptoms of anxiety.  ?Interventions: Cognitive Behavioral Therapy, Motivational Interviewing, and Psycho-education/Bibliotherapy  coping skills, and other evidenced-based practices will be used to promote progress towards healthy functioning and to help manage decrease symptoms associated with their diagnosis.  ?Treatment Regimen: Individual  skill building sessions every 2-3 weeks for addressing treatment goal/objective ?Target Date: 06/2022 ?Responsible Party: therapist and patient ?Person delivering treatment: Licensed Psychologist [name], PhD will support the patient's ability to achieve the goals identified. ?Resolved: No ? ?Problem/Need: communication challenge - Ethel has had some challenges with communicating with her family members.  ?Long-Term Goal #2: Improve communication between [place] and her family members.  ?Short-Term Objectives: ?Objective 2A: Myya will be able to identify the goals of her communication with her family members.  ?Objective 2B: Cyndel will be able to identify breakdowns in communication within  4 months. ?Objective 2C: Breeann will be able to implement more effective communication with her family members within 6 months. ?Interventions: Cognitive Behavioral Therapy, Assertiveness/Communication, and Motivational Interviewing , and other  evidenced-based practices will be used to promote progress towards healthy functioning and to help manage decrease symptoms associated with their diagnosis.  ?Treatment Regimen: Individual  skill building session

## 2021-11-14 NOTE — Patient Instructions (Signed)
Follow up as needed or as scheduled ?We'll call you with your GI appt ?Continue to use counseling and friend/family support to get through this ?If you decide you need medication to help- let me know! ?Call with any questions or concerns ?Hang in there!!! ?

## 2021-11-14 NOTE — Progress Notes (Signed)
? ?  Subjective:  ? ? Patient ID: Lauren Guerrero, female    DOB: 1976/06/13, 46 y.o.   MRN: [id number] ? ?HPI ?Family hx of colon cancer- father passed away 4 weeks ago from stage 4 colon cancer w/ bowel obstruction. ? ?Grief- dad had just visited 3 weeks prior to his passing.  They just returned from [place] and the funeral.  Pt is in touch w/ grief counseling.  Not interested in medication at this time but prefers to do counseling and time.  Pt has siblings for support ? ? ?Review of Systems ?For ROS see HPI  ?   ?Objective:  ? Physical Exam ?Vitals reviewed.  ?Constitutional:   ?   General: She is not in acute distress. ?   Appearance: Normal appearance. She is not ill-appearing.  ?HENT:  ?   Head: Normocephalic and atraumatic.  ?Skin: ?   General: Skin is warm and dry.  ?Neurological:  ?   General: No focal deficit present.  ?   Mental Status: She is alert and oriented to person, place, and time.  ?Psychiatric:     ?   Mood and Affect: Mood normal.     ?   Behavior: Behavior normal.     ?   Thought Content: Thought content normal.  ?   Comments: Appropriately tearful when discussing the loss of her dad  ? ? ? ? ? ?   ?Assessment & Plan:  ? ?Family hx colon cancer- new.  Pt's father just passed away last month from stage 4 colon cancer that he didn't know he had.  Will place referral to GI. ? ?Grief- new.  Pt is understandably shaken by the suddenness of her father's passing.  She reports a good support system and has reached out for counseling.  She is not interested in medication at this time but she knows we can revisit this in the future. ?

## 2021-11-19 ENCOUNTER — Other Ambulatory Visit: Payer: Self-pay | Admitting: Otolaryngology

## 2021-11-19 ENCOUNTER — Other Ambulatory Visit (HOSPITAL_BASED_OUTPATIENT_CLINIC_OR_DEPARTMENT_OTHER): Payer: Self-pay | Admitting: Otolaryngology

## 2021-11-19 DIAGNOSIS — E041 Nontoxic single thyroid nodule: Secondary | ICD-10-CM

## 2021-11-22 ENCOUNTER — Encounter: Payer: Self-pay | Admitting: Internal Medicine

## 2021-12-03 ENCOUNTER — Ambulatory Visit: Payer: No Typology Code available for payment source | Admitting: Clinical

## 2021-12-10 ENCOUNTER — Ambulatory Visit (INDEPENDENT_AMBULATORY_CARE_PROVIDER_SITE_OTHER): Payer: No Typology Code available for payment source | Admitting: Clinical

## 2021-12-10 DIAGNOSIS — F419 Anxiety disorder, unspecified: Secondary | ICD-10-CM

## 2021-12-10 NOTE — Progress Notes (Signed)
[facility] Counselor/Therapist Progress Note ? ?Patient ID: Lauren Guerrero, MRN: [id number]   ? ?Date: 12/10/21 ? ?Time Spent: 2:00 pm - 2:58 pm: 58 Minutes ? ?Type of Service Provided Individual Therapy  ?Type of Contact virtual (via Webex with real time audio and visual interaction)  ?Patient Location: home       ?Provider Location: office ? ?Colletta [place] Conyer participated from home, via video, and consented to treatment. Therapist participated from office.  ? ? ?Mental Status Exam: ?Appearance:  Casual and Well Groomed     ?Behavior: Appropriate and Sharing  ?Motor: Normal  ?Speech/Language:  Clear and Coherent and Normal Rate  ?Affect: Congruent and Tearful  ?Mood: sad at times   ?Thought process: normal  ?Thought content:   WNL  ?Sensory/Perceptual disturbances:   WNL  ?Orientation: oriented to person, place, time/date, and situation  ?Attention: Good  ?Concentration: Good  ?Memory: WNL  ?Fund of knowledge:  Good  ?Insight:   Fair to good  ?Judgment:  Good  ?Impulse Control: Good  ? ?Risk Assessment: No apparent indicators of HI or SI during session ? ? ?Presenting Problems, Reported Symptoms, and /or Interim History: Maymie presented for a session to address anxiety, mood challenges, and life stress. ? ?Subjective: [name] presented for an individual outpatient therapy session. The following was addressed during sessions.  ? ?[name] indicated that her communication with her daughter has improved somewhat and they are using some of the previously discussed strategies and have found them helpful.  [name] reported that her mood and anxiety is a bit better.  Between session she tried deep breathing and visualization and found deep breathing more helpful.  She is also engaging in a regular exercise routine.  During the visit, [name] reported challenges with both making new connections and fully engaging with the close people in her life.  Barriers to fully engaging with people that are close  to her and the negative thoughts associated with [name] holding herself back from sharing fully with these individuals was discussed.  The process for both deepening her current relationships and exploring ways to build new connections was discussed.  More adaptive helpful thinking was also discussed. ? ?Interventions/Psychotherapy Techniques Used During Session: Cognitive Behavioral Therapy, Assertiveness/Communication, and Interpersonal and Insight-Oriented  ? ?Diagnosis: Anxiety ? ?MENTAL HEALTH INTERVENTIONS USED DURING TREATMENT & PATIENT'S RESPONSE TO INTERVENTIONS:  ?Short-term Objective addressed today: [name] will be able to identify negative thoughts that are contributing to or perpetuating anxiety/stress and identify at least one alternative thought within 6 months. (Verbalize an understanding of the role that distorted thinking plays in creating fears, excessive worry, and ruminations). ?Mental health techniques used: Objective was addressed in session through the use of Cognitive Behavioral Therapy and Insight-Oriented and discussion. Cilicia's response was positive - she was able to identify some negative thought patterns and come up with alternative more helpful thoughts ?Progress Toward Goal: Progressing ? ?Short-term Objective addressed today:[name] will be able to identify breakdowns in communication within 4 months [name] will be able to implement more effective communication with her family members within 6 months. ?Mental health techniques used: Objective was addressed in session through the use of Assertiveness/Communication and Insight-Oriented and discussion. [name]'s response was generally positive, although she was tearful at times when discussing these things she embraced the discussion and come up with new ways to approach the situation.  ?Progress Toward Goal: Progressing ? ?PLAN  ?1. [name]  will return for a therapy session.   ?2. Homework Given: Look  for times that  she is holding herself back from saying something and determine if there is a way that she can share that piece of information with her trusted confidants, look for opportunities for activities that could foster social connection this homework will be reviewed with [name] at the next visit.  ?3. During the next session check in on mood and anxiety, check in on social, check in on sharing information with her trusted others and social opportunities.   ? ? ?[name], PhD ? Individual Treatment Plan - please see the note from 10/22/2021 for complete treatment plan information ? ?Problem/Need: Anxiety - [name] experiences anxiety which is exacerbated by the hectic schedule and her expectations of herself.  ? ?Long-Term Goal #1: Reduce overall level, frequency, and intensity of the feelings of anxiety evidenced by decreased anxiety symptoms and stress level per client report  ?Short-Term Objectives: ?Objective 1A: [name] will be able to measure her anxiety and stress level within 1 month.  ?Objective 1B: [name] will be able to describe the link between thoughts-feelings-and actions and identify the major components of her anxiety/stress within 3 months. ?Objective 1C: [name] will be able to identify negative thoughts that are contributing to or perpetuating anxiety/stress and identify at least one alternative thought within 6 months. (Verbalize an understanding of the role that distorted thinking plays in creating fears, excessive worry, and ruminations). ?Objective 1D: Develop a range of coping mechanisms to address negative thinking patterns and physiological symptoms of anxiety.  ?Interventions: Cognitive Behavioral Therapy, Motivational Interviewing, and Psycho-education/Bibliotherapy  coping skills, and other evidenced-based practices will be used to promote progress towards healthy functioning and to help manage decrease symptoms associated with their diagnosis.  ?Treatment Regimen: Individual  skill  building sessions every 2-3 weeks for addressing treatment goal/objective ?Target Date: 06/2022 ?Responsible Party: therapist and patient ?Person delivering treatment: Licensed Psychologist [name], PhD will support the patient's ability to achieve the goals identified. ?Resolved: No ? ?Problem/Need: communication challenge - Jalise has had some challenges with communicating with her family members.  ?Long-Term Goal #2: Improve communication between [place] and her family members.  ?Short-Term Objectives: ?Objective 2A: Arieon will be able to identify the goals of her communication with her family members.  ?Objective 2B: Roxanna will be able to identify breakdowns in communication within 4 months. ?Objective 2C: Suzann will be able to implement more effective communication with her family members within 6 months. ?Interventions: Cognitive Behavioral Therapy, Assertiveness/Communication, and Motivational Interviewing , and other evidenced-based practices will be used to promote progress towards healthy functioning and to help manage decrease symptoms associated with their diagnosis.  ?Treatment Regimen: Individual  skill building sessions every 2-3 weeks to address treatment goal/objective ?Target Date: 06/2022 ?Responsible Party: therapist and patient ?Person delivering treatment: Licensed Psychologist [name], PhD will support the patient's ability to achieve the goals identified. ?Resolved: No ? ?Problem/Need: Grief - Mariangel experienced the sudden loss of her father between the intake and initial therapy visit.  ?Long-Term Goal #3: Keshawn will be able to process her grief.  ?Short-Term Objectives: ?Objective 3A: Leigh will be able to talk about her feelings related to grief.  ?Objective 3B: Katty will be able to identify and make use of supports to help her process her grief and manage her emotions.  ?Interventions: Cognitive Behavioral Therapy, Motivational Interviewing, and Grief  Therapy , and other evidenced-based practices will be used to promote progress towards healthy functioning and to help manage decrease symptoms associated with their diagnosis.  ?Treatment Regimen:  Ronnald Nian

## 2021-12-11 ENCOUNTER — Ambulatory Visit (INDEPENDENT_AMBULATORY_CARE_PROVIDER_SITE_OTHER): Payer: No Typology Code available for payment source | Admitting: Internal Medicine

## 2021-12-11 ENCOUNTER — Encounter: Payer: Self-pay | Admitting: Internal Medicine

## 2021-12-11 VITALS — BP 98/60 | HR 94 | Ht 65.0 in | Wt 168.0 lb

## 2021-12-11 DIAGNOSIS — R140 Abdominal distension (gaseous): Secondary | ICD-10-CM

## 2021-12-11 DIAGNOSIS — Z800 Family history of malignant neoplasm of digestive organs: Secondary | ICD-10-CM | POA: Diagnosis not present

## 2021-12-11 DIAGNOSIS — K5900 Constipation, unspecified: Secondary | ICD-10-CM | POA: Diagnosis not present

## 2021-12-11 DIAGNOSIS — R1032 Left lower quadrant pain: Secondary | ICD-10-CM | POA: Diagnosis not present

## 2021-12-11 NOTE — Patient Instructions (Signed)
If you are age 45 or older, your body mass index should be between 23-30. Your Body mass index is 27.96 kg/m?Marland Kitchen If this is out of the aforementioned range listed, please consider follow up with your Primary Care Provider. ? ?If you are age 38 or younger, your body mass index should be between 19-25. Your Body mass index is 27.96 kg/m?Marland Kitchen If this is out of the aformentioned range listed, please consider follow up with your Primary Care Provider.  ? ?Drink 8 cups of water a day and walk 30 minutes a day. ? ?Please purchase the following medications over the counter and take as directed: ?Fiber supplement such as Benefiber- use as directed daily ?Miralax: Take as  directed up to 3 times a day to achieve regular bowel movements  ? ?You have been scheduled for a colonoscopy. Please follow written instructions given to you at your visit today.  ?Please pick up your prep supplies at the pharmacy within the next 1-3 days. ?If you use inhalers (even only as needed), please bring them with you on the day of your procedure. ? ?The [facility] providers would like to encourage you to use [facility] to communicate with providers for non-urgent requests or questions.  Due to long hold times on the telephone, sending your provider a message by [facility] may be a faster and more efficient way to get a response.  Please allow 48 business hours for a response.  Please remember that this is for non-urgent requests.  ? ?Thank you for entrusting me with your care and for choosing [employer], ?Dr. Christia Reading  ?

## 2021-12-11 NOTE — Progress Notes (Signed)
Chief Complaint: LLQ ab pain  HPI : 46 year old female with history of fibroids presents with LLQ ab pain  Patient's dad had colon cancer and recently passed away. Her dad was 59 when he died, but he was diagnosed with colon cancer via autopsy. Thus it is unclear exactly when he first developed colon cancer. Patient denies blood in stools. She has experienced some LLQ ab pain. Once in a while, the pain will flare up. Occasional constipation. As a result of the loss of her father, she has lost over 10 lbs. Denies N&V, dysphagia. She recently got a thryoid nodule biopsied. Denies chest burning or regurgitation. She has never had a colonoscopy in the past.  Wt Readings from Last 3 Encounters:  12/11/21 168 lb (76.2 kg)  11/14/21 165 lb 3.2 oz (74.9 kg)  10/15/21 168 lb (76.2 kg)   Past Medical History:  Diagnosis Date   Anemia    Fibroid    Medical history non-contributory    Thyroid disease    Past Surgical History:  Procedure Laterality Date   NO PAST SURGERIES     thyroid nodules     WISDOM TOOTH EXTRACTION     Family History  Problem Relation Age of Onset   Thyroid disease Mother    Hyperlipidemia Father    Hypertension Father    Heart disease Father    Colon cancer Father    Coronary artery disease Father    Stomach cancer Neg Hx    Esophageal cancer Neg Hx    Social History   Tobacco Use   Smoking status: Never   Smokeless tobacco: Never  Vaping Use   Vaping Use: Never used  Substance Use Topics   Alcohol use: Yes    Comment: socially   Drug use: No   Current Outpatient Medications  Medication Sig Dispense Refill   Multiple Vitamins-Minerals (MULTIVITAMIN ADULT PO) Take by mouth.     fluticasone (FLONASE) 50 MCG/ACT nasal spray Place 2 sprays into both nostrils daily. (Patient not taking: Reported on 12/11/2021) 16 g 6   fluticasone (FLONASE) 50 MCG/ACT nasal spray Administer 2 sprays in each nostril daily. (Patient not taking: Reported on 12/11/2021) 16 g 11    Vitamin D, Ergocalciferol, (DRISDOL) 1.25 MG (50000 UNIT) CAPS capsule Take 1 capsule by mouth every 7 days. (Patient not taking: Reported on 12/11/2021) 12 capsule 0   No current facility-administered medications for this visit.   Allergies  Allergen Reactions   Peanut-Containing Drug Products Anaphylaxis and Hives   Penicillins Hives    Did it involve swelling of the face/tongue/throat, SOB, or low BP? No Did it involve sudden or severe rash/hives, skin peeling, or any reaction on the inside of your mouth or nose? No Did you need to seek medical attention at a hospital or doctor's office? No When did it last happen?       If all above answers are "NO", may proceed with cephalosporin use.   Sulfa Antibiotics Hives     Review of Systems: All systems reviewed and negative except where noted in HPI.   Physical Exam: BP 98/60   Pulse 94   Ht '5\' 5"'$  (1.651 m)   Wt 168 lb (76.2 kg)   BMI 27.96 kg/m  Constitutional: Pleasant,well-developed, female in no acute distress. HEENT: Normocephalic and atraumatic. Conjunctivae are normal. No scleral icterus. Cardiovascular: Normal rate, regular rhythm.  Pulmonary/chest: Effort normal and breath sounds normal. No wheezing, rales or rhonchi. Abdominal: Soft, nondistended, tender in  the LLQ. Bowel sounds active throughout. There are no masses palpable. No hepatomegaly. Extremities: No edema Neurological: Alert and oriented to person place and time. Skin: Skin is warm and dry. No rashes noted. Psychiatric: Normal mood and affect. Behavior is normal.  Labs 08/2021: Ferritin low at 11. CMP nml.   Labs 09/2021: CBC nml, CMP nml. Lipase nml.  CT A/P w/contrast 10/15/21: IMPRESSION: 1. Few scattered colonic diverticulosis with no acute diverticulitis. 2. Degenerative intramural uterine fibroids.  ASSESSMENT AND PLAN: Family history of colon cancer LLQ ab pain Bloating Constipation Patient presents to discuss colon cancer screening after he  father recently passed away from colon cancer. With a first degree relative who developed colon cancer, patient would qualify for high risk colon cancer screening. Thus will plan for a colonoscopy for further evaluation. I went over this procedure in detail with the patient, and she is agreeable to proceeding. Patient does mention that she has some LLQ ab pain intermittently. This may be due to constipation since after review of her most recent CT scan from 09/2021, she does have a significant amount of stool burden on board. Will have the patient trial a low FODMAP diet and start daily Miralax/fiber to see if this helps with her symptoms. - Low FODMAP diet - Start fiber supplement and Miralax - Colonoscopy [facility]  Christia Reading, MD

## 2021-12-26 ENCOUNTER — Ambulatory Visit: Payer: No Typology Code available for payment source | Admitting: Clinical

## 2021-12-31 ENCOUNTER — Ambulatory Visit (INDEPENDENT_AMBULATORY_CARE_PROVIDER_SITE_OTHER): Payer: No Typology Code available for payment source | Admitting: Clinical

## 2021-12-31 DIAGNOSIS — F419 Anxiety disorder, unspecified: Secondary | ICD-10-CM | POA: Diagnosis not present

## 2021-12-31 NOTE — Progress Notes (Signed)
[facility] Counselor/Therapist Progress Note  Patient ID: Lauren Guerrero, MRN: [id number]    Date: 12/31/21  Time Spent: 11:03 am - 12:05 pm: 62 Minutes  Type of Service Provided Individual Therapy  Type of Contact virtual (via Webex with real time audio and visual interaction)  Patient Location: car parked at the park    Provider Location: office  Colletta [place] Olberding participated from car, via video, and consented to treatment. Therapist participated from office.    Mental Status Exam: Appearance:  Casual     Behavior: Sharing and Motivated  Motor: Normal  Speech/Language:  Clear and Coherent  Affect: Congruent  Mood: normal  Thought process: normal  Thought content:   WNL  Sensory/Perceptual disturbances:   WNL  Orientation: oriented to person, place, time/date, and situation  Attention: Good  Concentration: Good  Memory: WNL  Fund of knowledge:  Good  Insight:   Good  Judgment:  Good  Impulse Control: Good   Risk Assessment: No apparent indicators of HI or SI during the visit  Presenting Problems, Reported Symptoms, and /or Interim History: [place] presented to a session to address communication and anxiety concerns.   Subjective: [name] and presented for an individual outpatient therapy session. The following was addressed during sessions.   Shaunice reported that things have been going well for her.  She rated her mood as a 10 out of 10 (with 1 being sad, 5 being neutral, and 10 being happy).  She rated her anxiety as a 4 out of 10 (with 10 being a high level of anxiety).  She indicated that she had implemented tools discussed during the previous session and has noticed some improvement.  She described that she sometimes felt as if she were taking "baby steps" and pushing herself. This was normalized as a typical feeling when making behavioral changes, especially those that are anxiety provoking (she has been able to talk to people in the gym and other  settings for example).  She noted that people have also started to approach her. She identified a significant concern to discussed during the current session as the communication gap between her and her husband.  She reported that her brother had come to town and talked to her husband about some of the concerns.  Specifics about what Chastidy wanted to communicate to her husband were discussed. Bryna was encouraged to use 'I statements' and to talk about her feelings with her husband.  The idea that these conversations will likely need to be ongoing and most productively viewed as a negotiation or compromise between the 2 of them was discussed.Wyoma practiced discussing her feelings and what she was going to say with her husband.  Strategies to help [facility] manage her emotions if the conversation does not go as expected were also discussed.  Interventions/Psychotherapy Techniques Used During Session: Cognitive Behavioral Therapy and Assertiveness/Communication  Diagnosis: Anxiety  MENTAL HEALTH INTERVENTIONS USED DURING TREATMENT & PATIENT'S RESPONSE TO INTERVENTIONS:  Short-term Objective addressed today:  Tallie will be able to implement more effective communication with her family members within 6 months. Mental health techniques used: Objective was addressed in session through the use of Cognitive Behavioral Therapy and Assertiveness/Communication, discussion, and practice. Karmen's response was positive.  Progress Toward Goal: progressing  PLAN  1. Karigan  will return for a therapy session.   2. Homework Given:  carve out time and attempt to have an initial conversation with her husband about their communication and the things that she wants or  needs out of her relationship. This homework will be reviewed with [name] at the next visit.  3. During the next session check in on anxiety and mood, and conversation between [name] and her spouse.     [name], PhD      Individual Treatment Plan - please see the note from 10/22/2021 for complete treatment plan information  Problem/Need: Anxiety - Kaylean experiences anxiety which is exacerbated by the hectic schedule and her expectations of herself.   Long-Term Goal #1: Reduce overall level, frequency, and intensity of the feelings of anxiety evidenced by decreased anxiety symptoms and stress level per client report  Short-Term Objectives: Objective 1A: Maribella will be able to measure her anxiety and stress level within 1 month.  Objective 1B: Valyn will be able to describe the link between thoughts-feelings-and actions and identify the major components of her anxiety/stress within 3 months. Objective 1C: Dakiyah will be able to identify negative thoughts that are contributing to or perpetuating anxiety/stress and identify at least one alternative thought within 6 months. (Verbalize an understanding of the role that distorted thinking plays in creating fears, excessive worry, and ruminations). Objective 1D: Develop a range of coping mechanisms to address negative thinking patterns and physiological symptoms of anxiety.  Interventions: Cognitive Behavioral Therapy, Motivational Interviewing, and Psycho-education/Bibliotherapy  coping skills, and other evidenced-based practices will be used to promote progress towards healthy functioning and to help manage decrease symptoms associated with their diagnosis.  Treatment Regimen: Individual  skill building sessions every 2-3 weeks for addressing treatment goal/objective Target Date: 06/2022 Responsible Party: therapist and patient Person delivering treatment: Licensed Psychologist [name], PhD will support the patient's ability to achieve the goals identified. Resolved: No  Problem/Need: communication challenge - Shade has had some challenges with communicating with her family members.  Long-Term Goal #2: Improve communication between Neeses and her  family members.  Short-Term Objectives: Objective 2A: Kensi will be able to identify the goals of her communication with her family members.  Objective 2B: Kaelani will be able to identify breakdowns in communication within 4 months. Objective 2C: Bryella will be able to implement more effective communication with her family members within 6 months. Interventions: Cognitive Behavioral Therapy, Assertiveness/Communication, and Motivational Interviewing , and other evidenced-based practices will be used to promote progress towards healthy functioning and to help manage decrease symptoms associated with their diagnosis.  Treatment Regimen: Individual  skill building sessions every 2-3 weeks to address treatment goal/objective Target Date: 06/2022 Responsible Party: therapist and patient Person delivering treatment: Licensed Psychologist [name], PhD will support the patient's ability to achieve the goals identified. Resolved: No  Problem/Need: Grief - Solara experienced the sudden loss of her father between the intake and initial therapy visit.  Long-Term Goal #3: Dresden will be able to process her grief.  Short-Term Objectives: Objective 3A: Lachanda will be able to talk about her feelings related to grief.  Objective 3B: Geniece will be able to identify and make use of supports to help her process her grief and manage her emotions.  Interventions: Cognitive Behavioral Therapy, Motivational Interviewing, and Grief Therapy , and other evidenced-based practices will be used to promote progress towards healthy functioning and to help manage decrease symptoms associated with their diagnosis.  Treatment Regimen: Individual  skill building sessions every 2-3 weeks to address treatment goal/objective Target Date: 06/2022 Responsible Party: therapist and patient Person delivering treatment: Licensed Psychologist [name], PhD will support the patient's ability to achieve the goals  identified. Resolved: No  [name], PhD

## 2022-01-12 ENCOUNTER — Encounter: Payer: Self-pay | Admitting: Certified Registered Nurse Anesthetist

## 2022-01-17 ENCOUNTER — Telehealth: Payer: Self-pay

## 2022-01-17 DIAGNOSIS — E669 Obesity, unspecified: Secondary | ICD-10-CM

## 2022-01-20 ENCOUNTER — Encounter: Payer: No Typology Code available for payment source | Admitting: Internal Medicine

## 2022-01-21 ENCOUNTER — Ambulatory Visit (INDEPENDENT_AMBULATORY_CARE_PROVIDER_SITE_OTHER): Payer: No Typology Code available for payment source | Admitting: Clinical

## 2022-01-21 DIAGNOSIS — F419 Anxiety disorder, unspecified: Secondary | ICD-10-CM

## 2022-01-23 NOTE — Addendum Note (Signed)
Addended by: Midge Minium on: 01/23/2022 01:01 PM   Modules accepted: Orders

## 2022-01-23 NOTE — Telephone Encounter (Signed)
Referral placed.

## 2022-02-09 ENCOUNTER — Encounter: Payer: Self-pay | Admitting: Certified Registered Nurse Anesthetist

## 2022-02-10 ENCOUNTER — Telehealth: Payer: No Typology Code available for payment source | Admitting: Internal Medicine

## 2022-02-10 ENCOUNTER — Other Ambulatory Visit: Payer: Self-pay

## 2022-02-10 ENCOUNTER — Other Ambulatory Visit (HOSPITAL_COMMUNITY): Payer: Self-pay

## 2022-02-10 MED ORDER — NA SULFATE-K SULFATE-MG SULF 17.5-3.13-1.6 GM/177ML PO SOLN
1.0000 | Freq: Once | ORAL | 0 refills | Status: DC
  Filled 2022-02-10: qty 354, 1d supply, fill #0

## 2022-02-10 NOTE — Telephone Encounter (Signed)
Inbound call from patient seeking advise if there is an alternative drink she can use other than  gatorade with the Miralax. Please advise.

## 2022-02-10 NOTE — Telephone Encounter (Signed)
Left VM informing patient that suprep was sent to [facility] outpatient pharmacy and prep instructions were sent via [employer]. I also sent patient a mychart message.

## 2022-02-12 ENCOUNTER — Other Ambulatory Visit (HOSPITAL_COMMUNITY): Payer: Self-pay

## 2022-02-12 NOTE — Telephone Encounter (Signed)
Patient called requesting to speak with you regarding the difference between the miralax prep and the prescribed prep medications.

## 2022-02-12 NOTE — Telephone Encounter (Signed)
Returned patient call letting her know that Suprep is normally covered for [employer] and that she could do either Miralax or Suprep. Patient stated that she would call [facility] to see if they has Suprep available ans decide before 5 pm which prep she would do. I let patient know that instructions for suprep would be sent on Mychart.

## 2022-02-13 ENCOUNTER — Ambulatory Visit (AMBULATORY_SURGERY_CENTER): Payer: No Typology Code available for payment source | Admitting: Internal Medicine

## 2022-02-13 ENCOUNTER — Encounter: Payer: Self-pay | Admitting: Internal Medicine

## 2022-02-13 VITALS — BP 102/54 | HR 77 | Temp 98.0°F | Resp 16 | Ht 65.0 in | Wt 168.0 lb

## 2022-02-13 DIAGNOSIS — Z1211 Encounter for screening for malignant neoplasm of colon: Secondary | ICD-10-CM | POA: Diagnosis present

## 2022-02-13 DIAGNOSIS — D122 Benign neoplasm of ascending colon: Secondary | ICD-10-CM | POA: Diagnosis not present

## 2022-02-13 DIAGNOSIS — D123 Benign neoplasm of transverse colon: Secondary | ICD-10-CM | POA: Diagnosis not present

## 2022-02-13 DIAGNOSIS — Z800 Family history of malignant neoplasm of digestive organs: Secondary | ICD-10-CM | POA: Diagnosis not present

## 2022-02-13 DIAGNOSIS — R1032 Left lower quadrant pain: Secondary | ICD-10-CM

## 2022-02-13 DIAGNOSIS — D120 Benign neoplasm of cecum: Secondary | ICD-10-CM | POA: Diagnosis not present

## 2022-02-13 MED ORDER — SODIUM CHLORIDE 0.9 % IV SOLN: 500.0000 mL | Freq: Once | INTRAVENOUS | Status: DC

## 2022-02-13 NOTE — Patient Instructions (Signed)
Handout on polyps and diverticulosis given.    YOU HAD AN ENDOSCOPIC PROCEDURE TODAY AT [facility]:   Refer to the procedure report that was given to you for any specific questions about what was found during the examination.  If the procedure report does not answer your questions, please call your gastroenterologist to clarify.  If you requested that your care partner not be given the details of your procedure findings, then the procedure report has been included in a sealed envelope for you to review at your convenience later.  YOU SHOULD EXPECT: Some feelings of bloating in the abdomen. Passage of more gas than usual.  Walking can help get rid of the air that was put into your GI tract during the procedure and reduce the bloating. If you had a lower endoscopy (such as a colonoscopy or flexible sigmoidoscopy) you may notice spotting of blood in your stool or on the toilet paper. If you underwent a bowel prep for your procedure, you may not have a normal bowel movement for a few days.  Please Note:  You might notice some irritation and congestion in your nose or some drainage.  This is from the oxygen used during your procedure.  There is no need for concern and it should clear up in a day or so.  SYMPTOMS TO REPORT IMMEDIATELY:  Following lower endoscopy (colonoscopy or flexible sigmoidoscopy):  Excessive amounts of blood in the stool  Significant tenderness or worsening of abdominal pains  Swelling of the abdomen that is new, acute  Fever of 100F or higher   For urgent or emergent issues, a gastroenterologist can be reached at any hour by calling [phone]. Do not use MyChart messaging for urgent concerns.    DIET:  We do recommend a small meal at first, but then you may proceed to your regular diet.  Drink plenty of fluids but you should avoid alcoholic beverages for 24 hours.  ACTIVITY:  You should plan to take it easy for the rest of today and you should NOT  DRIVE or use heavy machinery until tomorrow (because of the sedation medicines used during the test).    FOLLOW UP: Our staff will call the number listed on your records the next business day following your procedure.  We will call around 7:15- 8:00 am to check on you and address any questions or concerns that you may have regarding the information given to you following your procedure. If we do not reach you, we will leave a message.  If you develop any symptoms (ie: fever, flu-like symptoms, shortness of breath, cough etc.) before then, please call [phone].  If you test positive for Covid 19 in the 2 weeks post procedure, please call and report this information to [place].    If any biopsies were taken you will be contacted by phone or by letter within the next 1-3 weeks.  Please call us at [phone] if you have not heard about the biopsies in 3 weeks.    SIGNATURES/CONFIDENTIALITY: You and/or your care partner have signed paperwork which will be entered into your electronic medical record.  These signatures attest to the fact that that the information above on your After Visit Summary has been reviewed and is understood.  Full responsibility of the confidentiality of this discharge information lies with you and/or your care-partner.

## 2022-02-13 NOTE — Progress Notes (Signed)
Patient arrived for scheduled procedure with spouse who is a [place] MD and insisted he be allowed to go work across the street. I personally explained the policy about requiring a care partner be on the premises and the care partner continued to argue that he will be allowed to attend his meeting.  Dr. Lorenso Courier came to speak with this gentleman and decided to proceed with the procedure.

## 2022-02-13 NOTE — Progress Notes (Signed)
Called to room to assist during endoscopic procedure.  Patient ID and intended procedure confirmed with present staff. Received instructions for my participation in the procedure from the performing physician.  

## 2022-02-13 NOTE — Op Note (Signed)
[facility] Patient Name: Lauren Guerrero Procedure Date: 02/13/2022 3:39 PM MRN: [id number] Endoscopist: Sonny Masters "Lauren Guerrero ,  Age: 46 Referring MD:  Date of Birth: [birth year] Gender: Female Account #: [id number] Procedure:                Colonoscopy Indications:              Screening in patient at increased risk: Family                            history of 1st-degree relative with colorectal                            cancer Medicines:                Monitored Anesthesia Care Procedure:                Pre-Anesthesia Assessment:                           - Prior to the procedure, a History and Physical                            was performed, and patient medications and                            allergies were reviewed. The patient's tolerance of                            previous anesthesia was also reviewed. The risks                            and benefits of the procedure and the sedation                            options and risks were discussed with the patient.                            All questions were answered, and informed consent                            was obtained. Prior Anticoagulants: The patient has                            taken no previous anticoagulant or antiplatelet                            agents. ASA Grade Assessment: II - A patient with                            mild systemic disease. After reviewing the risks                            and benefits, the patient was deemed in  satisfactory condition to undergo the procedure.                           After obtaining informed consent, the colonoscope                            was passed under direct vision. Throughout the                            procedure, the patient's blood pressure, pulse, and                            oxygen saturations were monitored continuously. The                            Colonoscope was introduced through the anus and                             advanced to the the terminal ileum. The colonoscopy                            was performed without difficulty. The patient                            tolerated the procedure well. The quality of the                            bowel preparation was excellent. The terminal                            ileum, ileocecal valve, appendiceal orifice, and                            rectum were photographed. Scope In: 3:44:13 PM Scope Out: 4:04:16 PM Scope Withdrawal Time: 0 hours 14 minutes 37 seconds  Total Procedure Duration: 0 hours 20 minutes 3 seconds  Findings:                 The terminal ileum appeared normal.                           Two sessile polyps were found in the ascending                            colon and ileocecal valve. The polyps were 1 to 2                            mm in size. These polyps were removed with a cold                            biopsy forceps. Resection and retrieval were                            complete.  A 3 mm polyp was found in the transverse colon. The                            polyp was sessile. The polyp was removed with a                            cold snare. Resection and retrieval were complete.                           A few large-mouthed diverticula were found in the                            sigmoid colon.                           Non-bleeding internal hemorrhoids were found during                            retroflexion. Complications:            No immediate complications. Estimated Blood Loss:     Estimated blood loss was minimal. Impression:               - The examined portion of the ileum was normal.                           - Two 1 to 2 mm polyps in the ascending colon and                            at the ileocecal valve, removed with a cold biopsy                            forceps. Resected and retrieved.                           - One 3 mm polyp in the transverse colon, removed                             with a cold snare. Resected and retrieved.                           - Diverticulosis in the sigmoid colon.                           - Non-bleeding internal hemorrhoids. Recommendation:           - Discharge patient to home (with escort).                           - Await pathology results.                           - The findings and recommendations were discussed  with the patient. Sonny Masters "Lauren Guerrero,  02/13/2022 4:13:12 PM

## 2022-02-13 NOTE — Progress Notes (Signed)
Report given to PACU, vss 

## 2022-02-13 NOTE — Progress Notes (Signed)
GASTROENTEROLOGY PROCEDURE H&P NOTE   Primary Care Physician: Midge Minium, MD    Reason for Procedure:   Family history of colon cancer, LLQ ab pain  Plan:    Colonoscopy  Patient is appropriate for endoscopic procedure(s) in the ambulatory ([facility]) setting.  The nature of the procedure, as well as the risks, benefits, and alternatives were carefully and thoroughly reviewed with the patient. Ample time for discussion and questions allowed. The patient understood, was satisfied, and agreed to proceed.     HPI: Lauren Guerrero is a 46 y.o. female who presents for colonoscopy for evaluation of family history of colon cancer.  Patient was most recently seen in the Gastroenterology Clinic on 12/11/21.  No interval change in medical history since that appointment. Please refer to that note for full details regarding GI history and clinical presentation.   Past Medical History:  Diagnosis Date   Anemia    Fibroid    Medical history non-contributory    Thyroid disease     Past Surgical History:  Procedure Laterality Date   NO PAST SURGERIES     thyroid nodules     WISDOM TOOTH EXTRACTION      Prior to Admission medications   Medication Sig Start Date End Date Taking? Authorizing Provider  fluticasone (FLONASE) 50 MCG/ACT nasal spray Place 2 sprays into both nostrils daily. Patient not taking: Reported on 12/11/2021 10/01/21   Maximiano Coss, NP  fluticasone [facility]) 50 MCG/ACT nasal spray Administer 2 sprays in each nostril daily. Patient not taking: Reported on 12/11/2021 10/17/21     Multiple Vitamins-Minerals (MULTIVITAMIN ADULT PO) Take by mouth.    [provider]  Na Sulfate-K Sulfate-Mg Sulf 17.5-3.13-1.6 GM/177ML SOLN Take 1 kit by mouth once for 1 dose. 02/10/22 02/13/22  Sharyn Creamer, MD  Vitamin D, Ergocalciferol, (DRISDOL) 1.25 MG (50000 UNIT) CAPS capsule Take 1 capsule by mouth every 7 days. Patient not taking: Reported on 12/11/2021 07/01/21   Midge Minium, MD    Current Outpatient Medications  Medication Sig Dispense Refill   fluticasone (FLONASE) 50 MCG/ACT nasal spray Place 2 sprays into both nostrils daily. (Patient not taking: Reported on 12/11/2021) 16 g 6   fluticasone (FLONASE) 50 MCG/ACT nasal spray Administer 2 sprays in each nostril daily. (Patient not taking: Reported on 12/11/2021) 16 g 11   Multiple Vitamins-Minerals (MULTIVITAMIN ADULT PO) Take by mouth.     Na Sulfate-K Sulfate-Mg Sulf 17.5-3.13-1.6 GM/177ML SOLN Take 1 kit by mouth once for 1 dose. 354 mL 0   Vitamin D, Ergocalciferol, (DRISDOL) 1.25 MG (50000 UNIT) CAPS capsule Take 1 capsule by mouth every 7 days. (Patient not taking: Reported on 12/11/2021) 12 capsule 0   Current Facility-Administered Medications  Medication Dose Route Frequency Provider Last Rate Last Admin   0.9 %  sodium chloride infusion  500 mL Intravenous Once Sharyn Creamer, MD        Allergies as of 02/13/2022 - Review Complete 02/13/2022  Allergen Reaction Noted   Peanut-containing drug products Anaphylaxis and Hives 12/21/2013   Penicillins Hives 12/21/2013   Sulfa antibiotics Hives 12/21/2013    Family History  Problem Relation Age of Onset   Thyroid disease Mother    Hyperlipidemia Father    Hypertension Father    Heart disease Father    Colon cancer Father    Coronary artery disease Father    Stomach cancer Neg Hx    Esophageal cancer Neg Hx     Social  History   Socioeconomic History   Marital status: Married    Spouse name: Not on file   Number of children: 2   Years of education: Not on file   Highest education level: Not on file  Occupational History   Occupation: self employment  Tobacco Use   Smoking status: Never   Smokeless tobacco: Never  Vaping Use   Vaping Use: Never used  Substance and Sexual Activity   Alcohol use: Yes    Comment: socially   Drug use: No   Sexual activity: Yes  Other Topics Concern   Not on file  Social History Narrative    Not on file   Social Determinants of Health   Financial Resource Strain: Not on file  Food Insecurity: Not on file  Transportation Needs: Not on file  Physical Activity: Not on file  Stress: Not on file  Social Connections: Not on file  Intimate Partner Violence: Not on file    Physical Exam: Vital signs in last 24 hours: BP 139/69   Pulse 97   Temp 98 F (36.7 C) (Other (Comment)) Comment (Src): forehead  Ht _0  (1.651 m)   Wt 168 lb (76.2 kg)   SpO2 98%   BMI 27.96 kg/m  GEN: NAD EYE: Sclerae anicteric ENT: MMM CV: Non-tachycardic Pulm: No increased WOB GI: Soft NEURO:  Alert & Oriented   Christia Reading, MD [facility]   02/13/2022 3:14 PM

## 2022-02-14 ENCOUNTER — Telehealth: Payer: Self-pay | Admitting: *Deleted

## 2022-02-14 NOTE — Telephone Encounter (Signed)
Attempted to call patient for their post-procedure follow-up call. No answer. Left voicemail.   

## 2022-02-18 ENCOUNTER — Encounter: Payer: Self-pay | Admitting: Internal Medicine

## 2022-02-25 ENCOUNTER — Ambulatory Visit: Payer: No Typology Code available for payment source | Admitting: Clinical

## 2022-03-04 ENCOUNTER — Encounter: Payer: No Typology Code available for payment source | Attending: Family Medicine | Admitting: Registered"

## 2022-03-04 ENCOUNTER — Encounter: Payer: Self-pay | Admitting: Registered"

## 2022-03-04 DIAGNOSIS — E669 Obesity, unspecified: Secondary | ICD-10-CM | POA: Insufficient documentation

## 2022-03-04 NOTE — Patient Instructions (Signed)
-   Aim to have 1/2 a plate of non-starchy vegetables with lunch and dinner.   - Try not to skip lunch.   - Aim to eat about every 3-5 hours.   - Balance snacks with carbohydrates and protein.   - Great job with water intake, being active, and getting adequate sleep.

## 2022-03-04 NOTE — Progress Notes (Signed)
Medical Nutrition Therapy  Appointment Start time:  8:25  Appointment End time:  9:27  Primary concerns today: recommended meal/snack combinations  Referral diagnosis: obesity Preferred learning style: no preference indicated Learning readiness: ready, change in progress   NUTRITION ASSESSMENT   Arrives with the 2 children stating she wants overall health and wellness. Reports family history of HTN, prediabetes, colon cancer on paternal side of family. Reports father passed away in 11-07-21.  States she wants to find the right food combinations when on-the-go with children. Also wants to know food combinations for vitamin adequacy.   States her last physical was in 05/2021. States she has lost weight since then and expecting a decrease in A1c value.  States she was more active prior to visiting home for 3 weeks during the summer but did a lot of walking while in [place] for 1 week. States she was attending aerobic, strength-training, and yoga classes 4-5 days/week and will resume once children are in school again.    Clinical Medical Hx: Vitamin D deficiency Medications: See list Labs: elevated A1c (5.7) Notable Signs/Symptoms: none reported   Lifestyle & Dietary Hx  Estimated daily fluid intake: 80-100 oz Supplements: See list Sleep: 7-8 hrs/night Stress / self-care: eat better, sleep better, exercise Current average weekly physical activity: barre class, aerobic/strength training classes 45-60 min, 4-5 days/week  24-Hr Dietary Recall First Meal (8-10:30 am): cereal (bran flakes) + blueberries + seeds + 0-2% milk + fruit + hot water with lemon Snack:  Second Meal (1 pm): sometimes skips; sandwich or rice + chicken + vegetables or salad  Snack: fruit or popcorn  Third Meal (8-9 pm): plantain + potatoes + curry goat + rice  Snack: water or tea or [place] icee Beverages: lactose-free 0-2% milk, water with lemon (4-5*20 oz; 80-100 oz), herbal tea    NUTRITION DIAGNOSIS  NB-1.1  Food and nutrition-related knowledge deficit As related to prediabetes.  As evidenced by lack of previous nutrition-related education.   NUTRITION INTERVENTION  Nutrition education (E-1) on the following topics: Pt was educated and counseled on metabolism, importance of not skipping meals, how carbohydrates work in the body, A1c ranges for prediabetes/diabetes, role of fiber in eating regimen, and the importance of movement. Discussed meal/snack planning and how to balance meals/snacks using MyPlate for prediabetes. Pt agreed with goals listed.   Handouts Provided Include  MyPlate for Prediabetes  Learning Style & Readiness for Change Teaching method utilized: Visual & Auditory  Demonstrated degree of understanding via: Teach Back  Barriers to learning/adherence to lifestyle change: none identified  Goals Established by Pt Aim to have 1/2 a plate of non-starchy vegetables with lunch and dinner.  Try not to skip lunch.  Aim to eat about every 3-5 hours.  Balance snacks with carbohydrates and protein.  Great job with water intake, being active, and getting adequate sleep.    MONITORING & EVALUATION Dietary intake, weekly physical activity.  Next Steps  Patient is to follow-up in 3 months.

## 2022-03-11 ENCOUNTER — Ambulatory Visit (INDEPENDENT_AMBULATORY_CARE_PROVIDER_SITE_OTHER): Payer: No Typology Code available for payment source | Admitting: Clinical

## 2022-03-11 DIAGNOSIS — F419 Anxiety disorder, unspecified: Secondary | ICD-10-CM

## 2022-03-11 NOTE — Progress Notes (Signed)
[facility] Counselor/Therapist Progress Note  Patient ID: Lauren Guerrero, MRN: [id number]    Date: 03/11/22  Time Spent: 10:03 am - 11:01 am: 58 Minutes  Type of Service Provided Individual Therapy  Type of Contact virtual (via Webex with real time audio and visual interaction)  Patient Location: home       Provider Location: office  Colletta [place] Thelen participated from home, via video, and consented to treatment. Therapist participated from office.   Mental Status Exam: Appearance:  Fairly Groomed     Behavior: Appropriate and Sharing  Motor: Normal  Speech/Language:  Clear and Coherent and Normal Rate  Affect: Appropriate  Mood: normal  Thought process: normal  Thought content:   WNL  Sensory/Perceptual disturbances:   WNL  Orientation: oriented to person, place, time/date, and situation  Attention: Good  Concentration: Good  Memory: WNL  Fund of knowledge:  Good  Insight:   Fair  Judgment:  Fair  Impulse Control: Good   Risk Assessment: no apparent indicator of SI or HI during the visit   Presenting Problems, Reported Symptoms, and /or Interim History: Kaleya presented to a visit to address life stress, communication, and anxiety/mood challenges  Subjective: Lauren Guerrero presented for an individual outpatient therapy session. The following was addressed during sessions.   Lauren Guerrero reported that summer has been going pretty well.  She has been working to build more communication with her husband and they have had a few successful conversations.  However they have not yet had the desired conversation about finances.  What was getting in the way of this conversation was discussed.  A plan for moving forward was determined. Lauren Guerrero reported that their mood and anxiety has been up and down.  They did visit their family in [place] and went to the grave site which they noted was challenging.  Avoidance and fears of pursuing interests were discussed.  Negative thinking  associated with "should" thoughts  and more adaptive thinking was discussed.  The origins of this type of thinking was also discussed with strategies of how to overcome it.  The challenges with all or nothing thinking were discussed along with planning for a potentially longer ramp up period.   Interventions/Psychotherapy Techniques Used During Session: Cognitive Behavioral Therapy, Assertiveness/Communication, and Solution-Oriented/Positive Psychology  Diagnosis: Anxiety  MENTAL HEALTH INTERVENTIONS USED DURING TREATMENT & PATIENT'S RESPONSE TO INTERVENTIONS:  Short-term Objective addressed today: Lauren Guerrero will be able to identify negative thoughts that are contributing to or perpetuating anxiety/stress and identify at least one alternative thought within 6 months. (Verbalize an understanding of the role that distorted thinking plays in creating fears, excessive worry, and ruminations). Mental health techniques used: Objective was addressed in session through the use of Cognitive Behavioral Therapy and discussion. Berniece's response was positive.  Progress Toward Goal: Progressing  Short-term Objective addressed today:Lauren Guerrero will be able to implement more effective communication with her family members within 6 months. Mental health techniques used: Objective was addressed in session through the use of Cognitive Behavioral Therapy, Assertiveness/Communication, and Solution-Oriented/Positive Psychology, and discussion. Lauren Guerrero's response was positive.  Progress Toward Goal: Progressing     PLAN  1. [name]  will return for a therapy session.   2. 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 This homework will be reviewed with [name] at the next visit.  3. During the next session  check in on mood and anxiety, next  steps in creating more variation with things like art class and/or volunteering, check in on communication with husband and planned discussions.     [name], PhD   Individual Treatment Plan - please see the note from 10/22/2021 for complete treatment plan information  Problem/Need: Anxiety - Lauren Guerrero experiences anxiety which is exacerbated by the hectic schedule and her expectations of herself.   Long-Term Goal #1: Reduce overall level, frequency, and intensity of the feelings of anxiety evidenced by decreased anxiety symptoms and stress level per client report  Short-Term Objectives: Objective 1A: Lauren Guerrero will be able to measure her anxiety and stress level within 1 month.  Objective 1B: Lauren Guerrero will be able to describe the link between thoughts-feelings-and actions and identify the major components of her anxiety/stress within 3 months. Objective 1C: Lauren Guerrero will be able to identify negative thoughts that are contributing to or perpetuating anxiety/stress and identify at least one alternative thought within 6 months. (Verbalize an understanding of the role that distorted thinking plays in creating fears, excessive worry, and ruminations). Objective 1D: Develop a range of coping mechanisms to address negative thinking patterns and physiological symptoms of anxiety.  Interventions: Cognitive Behavioral Therapy, Motivational Interviewing, and Psycho-education/Bibliotherapy  coping skills, and other evidenced-based practices will be used to promote progress towards healthy functioning and to help manage decrease symptoms associated with their diagnosis.  Treatment Regimen: Individual  skill building sessions every 2-3 weeks for addressing treatment goal/objective Target Date: 06/2022 Responsible Party: therapist and patient Person delivering treatment: Licensed Psychologist [name], PhD will support the patient's ability to achieve the goals identified. Resolved: No  Problem/Need:  communication challenge - Lauren Guerrero has had some challenges with communicating with her family members.  Long-Term Goal #2: Improve communication between [place] and her family members.  Short-Term Objectives: Objective 2A: Anyelina will be able to identify the goals of her communication with her family members.  Objective 2B: Kendelle will be able to identify breakdowns in communication within 4 months. Objective 2C: Ayslin will be able to implement more effective communication with her family members within 6 months. Interventions: Cognitive Behavioral Therapy, Assertiveness/Communication, and Motivational Interviewing , and other evidenced-based practices will be used to promote progress towards healthy functioning and to help manage decrease symptoms associated with their diagnosis.  Treatment Regimen: Individual  skill building sessions every 2-3 weeks to address treatment goal/objective Target Date: 06/2022 Responsible Party: therapist and patient Person delivering treatment: Licensed Psychologist [name], PhD will support the patient's ability to achieve the goals identified. Resolved: No  Problem/Need: Grief - Starsha experienced the sudden loss of her father between the intake and initial therapy visit.  Long-Term Goal #3: Hlee will be able to process her grief.  Short-Term Objectives: Objective 3A: Talor will be able to talk about her feelings related to grief.  Objective 3B: Tayanna will be able to identify and make use of supports to help her process her grief and manage her emotions.  Interventions: Cognitive Behavioral Therapy, Motivational Interviewing, and Grief Therapy , and other evidenced-based practices will be used to promote progress towards healthy functioning and to help manage decrease symptoms associated with their diagnosis.  Treatment Regimen: Individual  skill building sessions every 2-3 weeks to address treatment goal/objective Target Date:  06/2022 Responsible Party: therapist and patient Person delivering treatment: Licensed Psychologist [name], PhD will support the patient's ability to achieve the goals identified. Resolved: No  [name], PhD

## 2022-03-14 ENCOUNTER — Ambulatory Visit (INDEPENDENT_AMBULATORY_CARE_PROVIDER_SITE_OTHER): Payer: No Typology Code available for payment source | Admitting: Family Medicine

## 2022-03-14 ENCOUNTER — Encounter: Payer: Self-pay | Admitting: Family Medicine

## 2022-03-14 ENCOUNTER — Other Ambulatory Visit (HOSPITAL_COMMUNITY): Payer: Self-pay

## 2022-03-14 VITALS — BP 120/76 | HR 104 | Temp 97.9°F | Resp 20 | Ht 65.0 in | Wt 162.6 lb

## 2022-03-14 DIAGNOSIS — M62838 Other muscle spasm: Secondary | ICD-10-CM

## 2022-03-14 DIAGNOSIS — F419 Anxiety disorder, unspecified: Secondary | ICD-10-CM

## 2022-03-14 MED ORDER — METHOCARBAMOL 500 MG PO TABS
500.0000 mg | ORAL_TABLET | Freq: Three times a day (TID) | ORAL | 0 refills | Status: DC | PRN
  Filled 2022-03-14: qty 30, 10d supply, fill #0

## 2022-03-14 NOTE — Patient Instructions (Addendum)
Message me in 1-2 weeks and let me know how things are going Use a heating pad for relief of muscle spasm Try and get a massage to help w/ tension relief Use the Methocarbamol prior to bed to help w/ muscle spasm and sleep Continue to work on stress management- exercise, yoga, meditation, journal writing, art, etc Call with any questions or concerns Hang in there!!

## 2022-03-14 NOTE — Progress Notes (Signed)
   Subjective:    Patient ID: Lauren Guerrero, female    DOB: [birth year], 46 y.o.   MRN: [id number]  HPI Anxiety- pt reports she will get 'worked up' at times.  When she gets overwhelmed she will notice strange sensations or 'nerve firing'.  Currently having sxs in neck that travel up into her head and down into back.  Sxs improve w/ exercise, meditation, relaxation.  Feels that sadness and feelings of being overwhelmed are improving.  Sleep is fragmented.  Some daytime fatigue 'but not much'.   Review of Systems For ROS see HPI     Objective:   Physical Exam Vitals reviewed.  Constitutional:      General: She is not in acute distress.    Appearance: Normal appearance. She is not ill-appearing.  HENT:     Head: Normocephalic and atraumatic.  Eyes:     Extraocular Movements: Extraocular movements intact.     Conjunctiva/sclera: Conjunctivae normal.     Pupils: Pupils are equal, round, and reactive to light.  Neck:     Comments: + trap spasm bilaterally Cardiovascular:     Rate and Rhythm: Normal rate and regular rhythm.  Pulmonary:     Effort: Pulmonary effort is normal. No respiratory distress.  Musculoskeletal:        General: No deformity.     Cervical back: Normal range of motion and neck supple. No rigidity.  Skin:    General: Skin is warm and dry.  Neurological:     General: No focal deficit present.     Mental Status: She is alert and oriented to person, place, and time.  Psychiatric:        Mood and Affect: Mood normal.        Behavior: Behavior normal.        Thought Content: Thought content normal.           Assessment & Plan:

## 2022-03-18 ENCOUNTER — Ambulatory Visit: Payer: No Typology Code available for payment source | Admitting: Registered"

## 2022-03-22 DIAGNOSIS — F419 Anxiety disorder, unspecified: Secondary | ICD-10-CM | POA: Insufficient documentation

## 2022-03-22 DIAGNOSIS — M62838 Other muscle spasm: Secondary | ICD-10-CM | POA: Insufficient documentation

## 2022-03-22 NOTE — Assessment & Plan Note (Signed)
New.  Pt's father died unexpectedly earlier this year.  Since then, she is overly attuned to her body and any new or different sensations.  She is able to calm herself w/ exercise, meditation, relaxation.  She is not interested in medication at this time  Will follow.

## 2022-03-22 NOTE — Assessment & Plan Note (Signed)
New.  Suspect this is stress related as pt holds tension in her neck and upper back.  Encouraged massage, heat, stretching, and Methocarbamol prn.  Pt expressed understanding and is in agreement w/ plan.

## 2022-03-25 ENCOUNTER — Encounter: Payer: Self-pay | Admitting: Family Medicine

## 2022-03-25 LAB — HM PAP SMEAR

## 2022-03-27 ENCOUNTER — Ambulatory Visit (INDEPENDENT_AMBULATORY_CARE_PROVIDER_SITE_OTHER): Payer: No Typology Code available for payment source | Admitting: Clinical

## 2022-03-27 DIAGNOSIS — F419 Anxiety disorder, unspecified: Secondary | ICD-10-CM

## 2022-03-27 NOTE — Progress Notes (Signed)
[place] Counselor/Therapist Progress Note  Patient ID: Lauren Guerrero, MRN: [id number]    Date: 03/27/22  Time Spent: 10:02 am - 11:00 am: 58 Minutes  Type of Service Provided Individual Therapy  Type of Contact virtual (via Web[employer] with real time audio and visual interaction)  Patient Location: home       Provider Location: office  Lauren Guerrero participated from home, via video, and consented to treatment. Therapist participated from office.    Mental Status Exam: Appearance:  Casual and Well Groomed     Behavior: Appropriate, Sharing, and Motivated  Motor: Normal  Speech/Language:  Clear and Coherent and Normal Rate  Affect: Appropriate  Mood: normal  Thought process: goal directed  Thought content:   WNL  Sensory/Perceptual disturbances:   WNL  Orientation: oriented to person, place, time/date, and situation  Attention: Good  Concentration: Good  Memory: WNL  Fund of knowledge:  Fair  Insight:   Fair  Judgment:  Good  Impulse Control: Good   Risk Assessment: No apparent indicator of HI or SI during the visit  Presenting Problems, Reported Symptoms, and /or Interim History: Lauren Guerrero presented to the visit to discuss anxiety and life stress   Subjective: Lauren Guerrero presented for an individual outpatient therapy session. The following was addressed during sessions.   Lauren Guerrero reported that things have been going well overall.  She reported that her mood was a 7 out of 10 (with 1 being sad, 5 being neutral, and 10 being happy).  She reported that her anxiety however has been a little bit higher (a 5 to a 6; with 10 being a high level of anxiety).  She indicated that trying to map out her life plan was causing her the high level of stress.  Strategies for helping to break down this type of task were discussed including listing out tasks and dividing them by the must do tasks, nice to do tasks, and ideal or eventual tasks.  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  Therapist discussed breaking down long-term goals into attainable short-term goals.  The idea of ' all or nothing thinking' was discussed and explained including how this can perpetuate anxiety.  Ways to combat this type of thinking was discussed.  Strategies for managing feeling anxious and overwhelmed when trying to create smaller goals was discussed.  Interventions/Psychotherapy Techniques Used During Session: Cognitive Behavioral Therapy, Solution-Oriented/Positive Psychology, and executive functioning supports  Diagnosis: Anxiety  MENTAL HEALTH INTERVENTIONS USED DURING TREATMENT & PATIENT'S RESPONSE TO INTERVENTIONS:  Short-term Objective addressed today: Lauren Guerrero will be able to identify negative thoughts that are contributing to or perpetuating anxiety/stress and identify at least one alternative thought within 6 months. (Verbalize an understanding of the role that distorted thinking plays in creating fears, excessive worry, and ruminations). Mental health techniques used: Objective was addressed in session through the use of Cognitive Behavioral Therapy and Solution-Oriented/Positive Psychology, discussion, making/reviewing lists, and teaching skills. Lauren Guerrero's response was positive .  Progress Toward Goal: progressing  PLAN  1. Lauren Guerrero will return for a therapy session.   2. Homework Given: Look for instances when engaging in anxiety provoking thought patterns including all or nothing thinking and ' should' thinking, attempt to begin to create this schedule focusing on small attainable goals rather than trying to move from where she is now to her ultimate and goal. This homework will be reviewed with Lauren Guerrero at the next visit.  3. During  the next session discuss mood, anxiety, and scheduling an executive functioning.     Lauren Guerrero, Lauren Guerrero      Individual Treatment Plan - please see the note from 10/22/2021 for  complete treatment plan information  Problem/Need: Anxiety - Lauren Guerrero experiences anxiety which is exacerbated by the hectic schedule and her expectations of herself.   Long-Term Goal #1: Reduce overall level, frequency, and intensity of the feelings of anxiety evidenced by decreased anxiety symptoms and stress level per client report  Short-Term Objectives: Objective 1A: Lauren Guerrero will be able to measure her anxiety and stress level within 1 month.  Objective 1B: Lauren Guerrero will be able to describe the link between thoughts-feelings-and actions and identify the major components of her anxiety/stress within 3 months. Objective 1C: Lauren Guerrero will be able to identify negative thoughts that are contributing to or perpetuating anxiety/stress and identify at least one alternative thought within 6 months. (Verbalize an understanding of the role that distorted thinking plays in creating fears, excessive worry, and ruminations). Objective 1D: Develop a range of coping mechanisms to address negative thinking patterns and physiological symptoms of anxiety.  Interventions: Cognitive Behavioral Therapy, Motivational Interviewing, and Psycho-education/Bibliotherapy  coping skills, and other evidenced-based practices will be used to promote progress towards healthy functioning and to help manage decrease symptoms associated with their diagnosis.  Treatment Regimen: Individual  skill building sessions every 2-3 weeks for addressing treatment goal/objective Target Date: 06/2022 Responsible Party: therapist and patient Person delivering treatment: Licensed Psychologist Lauren Guerrero, Lauren Guerrero will support the patient's ability to achieve the goals identified. Resolved: No  Problem/Need: communication challenge - Lauren Guerrero has had some challenges with communicating with her family members.  Long-Term Goal #2: Improve communication between Lauren Guerrero and her family members.  Short-Term Objectives: Objective 2A: Lauren Guerrero will  be able to identify the goals of her communication with her family members.  Objective 2B: Lauren Guerrero will be able to identify breakdowns in communication within 4 months. Objective 2C: Nashae will be able to implement more effective communication with her family members within 6 months. Interventions: Cognitive Behavioral Therapy, Assertiveness/Communication, and Motivational Interviewing , and other evidenced-based practices will be used to promote progress towards healthy functioning and to help manage decrease symptoms associated with their diagnosis.  Treatment Regimen: Individual  skill building sessions every 2-3 weeks to address treatment goal/objective Target Date: 06/2022 Responsible Party: therapist and patient Person delivering treatment: Licensed Psychologist Lauren Guerrero, Lauren Guerrero will support the patient's ability to achieve the goals identified. Resolved: No  Problem/Need: Grief - Earla experienced the sudden loss of her father between the intake and initial therapy visit.  Long-Term Goal #3: Suha will be able to process her grief.  Short-Term Objectives: Objective 3A: Babara will be able to talk about her feelings related to grief.  Objective 3B: Marnette will be able to identify and make use of supports to help her process her grief and manage her emotions.  Interventions: Cognitive Behavioral Therapy, Motivational Interviewing, and Grief Therapy , and other evidenced-based practices will be used to promote progress towards healthy functioning and to help manage decrease symptoms associated with their diagnosis.  Treatment Regimen: Individual  skill building sessions every 2-3 weeks to address treatment goal/objective Target Date: 06/2022 Responsible Party: therapist and patient Person delivering treatment: Licensed Psychologist Lauren Guerrero, Lauren Guerrero will support the patient's ability to achieve the goals identified. Resolved: No  Lauren Guerrero, Lauren Guerrero

## 2022-04-10 ENCOUNTER — Encounter: Payer: Self-pay | Admitting: Family Medicine

## 2022-04-10 ENCOUNTER — Ambulatory Visit (INDEPENDENT_AMBULATORY_CARE_PROVIDER_SITE_OTHER): Payer: No Typology Code available for payment source | Admitting: Family Medicine

## 2022-04-10 VITALS — BP 108/62 | HR 97 | Temp 97.4°F | Resp 16 | Ht 65.0 in | Wt 161.8 lb

## 2022-04-10 DIAGNOSIS — Z23 Encounter for immunization: Secondary | ICD-10-CM

## 2022-04-10 DIAGNOSIS — R29898 Other symptoms and signs involving the musculoskeletal system: Secondary | ICD-10-CM

## 2022-04-10 NOTE — Progress Notes (Signed)
   Subjective:    Patient ID: Lauren Guerrero, female    DOB: Jan 30, 1976, 46 y.o.   MRN: [id number]  HPI Leg weakness- sxs started this week.  Bilateral.  Pt notes sxs seem to improve w/ Vit D supplement.  Was able to do elliptical for 30 minutes w/o difficulty.  Denies numbness/tingling.  Sxs are symmetric. Reports that sxs seemed to occur after sitting or lying for prolonged periods.    Review of Systems For ROS see HPI     Objective:   Physical Exam Vitals reviewed.  Constitutional:      General: She is not in acute distress.    Appearance: Normal appearance. She is not ill-appearing.  HENT:     Head: Normocephalic and atraumatic.  Skin:    General: Skin is warm and dry.  Neurological:     General: No focal deficit present.     Mental Status: She is alert and oriented to person, place, and time.     Motor: No weakness (strength 5/5 and symmetric throughout).     Coordination: Coordination normal.     Gait: Gait normal.     Deep Tendon Reflexes: Reflexes normal.  Psychiatric:        Mood and Affect: Mood normal.        Behavior: Behavior normal.        Thought Content: Thought content normal.           Assessment & Plan:   Bilateral leg weakness- new.  Pt is able to exercise w/o difficutly.  Strength is 5/5 on exam today.  Suspect she is having blood pooling in her pelvis w/ prolonged sitting or lying down and then upon standing, the blood rushes to her feet- leaving the legs feeling weak and wobbly due to her already low blood pressure.  Will check labs to r/o underlying cause for weakness- thyroid abnormality, anemia, electrolyte disturbance, etc.  Pt encouraged to increase water intake, increase salt intake, and change positions frequently and slowly.  Pt expressed understanding and is in agreement w/ plan.

## 2022-04-10 NOTE — Patient Instructions (Addendum)
Follow up as needed or as scheduled We'll notify you of your lab results and make any changes if needed Make sure you are drinking LOTS of water Increase your salt intake Change positions frequently and slowly Call with any questions or concerns Hang in there!!!

## 2022-04-11 ENCOUNTER — Other Ambulatory Visit: Payer: Self-pay

## 2022-04-11 ENCOUNTER — Other Ambulatory Visit (HOSPITAL_COMMUNITY): Payer: Self-pay

## 2022-04-11 LAB — BASIC METABOLIC PANEL
BUN: 12 mg/dL (ref 6–23)
CO2: 30 mEq/L (ref 19–32)
Calcium: 9.4 mg/dL (ref 8.4–10.5)
Chloride: 104 mEq/L (ref 96–112)
Creatinine, Ser: 0.83 mg/dL (ref 0.40–1.20)
GFR: 85.2 mL/min (ref >60.00)
Glucose, Bld: 84 mg/dL (ref 70–99)
Potassium: 4 mEq/L (ref 3.5–5.1)
Sodium: 140 mEq/L (ref 135–145)

## 2022-04-11 LAB — HEPATIC FUNCTION PANEL
ALT: 14 U/L (ref 0–35)
AST: 18 U/L (ref 0–37)
Albumin: 4.2 g/dL (ref 3.5–5.2)
Alkaline Phosphatase: 49 U/L (ref 39–117)
Bilirubin, Direct: 0.1 mg/dL (ref 0.0–0.3)
Total Bilirubin: 0.4 mg/dL (ref 0.2–1.2)
Total Protein: 7.3 g/dL (ref 6.0–8.3)

## 2022-04-11 LAB — CBC WITH DIFFERENTIAL/PLATELET
Basophils Absolute: 0.1 10*3/uL (ref 0.0–0.1)
Basophils Relative: 1.1 % (ref 0.0–3.0)
Eosinophils Absolute: 0 10*3/uL (ref 0.0–0.7)
Eosinophils Relative: 0.4 % (ref 0.0–5.0)
HCT: 36.5 % (ref 36.0–46.0)
Hemoglobin: 12.1 g/dL (ref 12.0–15.0)
Lymphocytes Relative: 25.3 % (ref 12.0–46.0)
Lymphs Abs: 1.4 10*3/uL (ref 0.7–4.0)
MCHC: 33.1 g/dL (ref 30.0–36.0)
MCV: 86.7 fl (ref 78.0–100.0)
Monocytes Absolute: 0.3 10*3/uL (ref 0.1–1.0)
Monocytes Relative: 5.6 % (ref 3.0–12.0)
Neutro Abs: 3.6 10*3/uL (ref 1.4–7.7)
Neutrophils Relative %: 67.6 % (ref 43.0–77.0)
Platelets: 284 10*3/uL (ref 150.0–400.0)
RBC: 4.21 Mil/uL (ref 3.87–5.11)
RDW: 14.1 % (ref 11.5–15.5)
WBC: 5.4 10*3/uL (ref 4.0–10.5)

## 2022-04-11 LAB — TSH: TSH: 1.03 u[IU]/mL (ref 0.35–5.50)

## 2022-04-11 LAB — VITAMIN D 25 HYDROXY (VIT D DEFICIENCY, FRACTURES): VITD: 27 ng/mL — ABNORMAL LOW (ref 30.00–100.00)

## 2022-04-11 MED ORDER — VITAMIN D (ERGOCALCIFEROL) 1.25 MG (50000 UNIT) PO CAPS
50000.0000 [IU] | ORAL_CAPSULE | ORAL | 0 refills | Status: DC
  Filled 2022-04-11: qty 12, 84d supply, fill #0

## 2022-04-12 ENCOUNTER — Other Ambulatory Visit (HOSPITAL_COMMUNITY): Payer: Self-pay

## 2022-04-15 ENCOUNTER — Telehealth: Payer: Self-pay | Admitting: Family Medicine

## 2022-04-15 ENCOUNTER — Encounter: Payer: No Typology Code available for payment source | Admitting: Registered"

## 2022-04-15 NOTE — Telephone Encounter (Signed)
Caller name: Lauren Guerrero  On DPR? :yes/no: Yes  Call back number: [phone]  Provider they see: Birdie Riddle   Reason for call: Pt called stating that she needs introduction on how to take her multiple  vitamins and her vitamin D. Pt want to know should she take them together or one before the other one. Please advise

## 2022-04-15 NOTE — Telephone Encounter (Signed)
Spoke to the pt . Advised her that she can take them as she feels nesscarry. Advised her that she can take the Vit D in the am and multiple in am or vinca versa

## 2022-04-17 ENCOUNTER — Ambulatory Visit (INDEPENDENT_AMBULATORY_CARE_PROVIDER_SITE_OTHER): Payer: No Typology Code available for payment source | Admitting: Clinical

## 2022-04-17 DIAGNOSIS — F419 Anxiety disorder, unspecified: Secondary | ICD-10-CM | POA: Diagnosis not present

## 2022-04-17 NOTE — Progress Notes (Signed)
[facility] Counselor/Therapist Progress Note  Patient ID: Lauren Guerrero, MRN: [id number]    Date: 04/17/2022  Time Spent: 10:01 am - 11:00 am: 59 Minutes  Type of Service Provided Individual Therapy  Type of Contact virtual (via [place] with real time audio and visual interaction) Patient Location: home       Provider Location: office  Colletta [place] Hu participated from home, via video, and consented to treatment. Therapist participated from office.    Mental Status Exam: Appearance:  Neat and Well Groomed     Behavior: Appropriate and Sharing  Motor: Normal  Speech/Language:  Clear and Coherent  Affect: Appropriate  Mood: normal  Thought process: goal directed  Thought content:   WNL  Sensory/Perceptual disturbances:   WNL  Orientation: oriented to person, place, time/date, and situation  Attention: Good  Concentration: Good  Memory: WNL  Fund of knowledge:  Good  Insight:   Fair  Judgment:  Good  Impulse Control: Good   Risk Assessment: No apparent indicators of HI or SI during the visit   Presenting Problems, Reported Symptoms, and /or Interim History: [name] presented to a visit to address anxiety and life stress.  Subjective: [name] presented for an individual outpatient therapy session. The following was addressed during sessions.   [name] reported that her mood is improved and rated herself as a 10 out of 10 (with 1 being sad, 5 being neutral, and 10 being happy).  She indicated that her anxiety has also shown some improvement but rated it as a 5 out of 10 (with 10 being a high level of anxiety).  Strategies to continue to organize her physical space and time were discussed. [name] reported that she was able to identify several instances where she was engaging in all or nothing thinking related to this and used her helpful thoughts to move beyond this type of thinking.  Strategies for managing large tasks that could be overwhelming were  discussed.  The conversation with her husband about finances was also discussed as although [name] has reportedly brought up the topic the conversation that she was hoping to have has not occurred.  Barriers were discussed and planned for and she set a goal of beginning the conversation.  Interventions/Psychotherapy Techniques Used During Session: Cognitive Behavioral Therapy and Assertiveness/Communication  Diagnosis: Anxiety  MENTAL HEALTH INTERVENTIONS USED DURING TREATMENT & PATIENT'S RESPONSE TO INTERVENTIONS:  Short-term Objective addressed today:Develop a range of coping mechanisms to address negative thinking patterns and physiological symptoms of anxiety.  Mental health techniques used: Objective was addressed in session through the use of Cognitive Behavioral Therapy and discussion. [name]'s  response was positive.  Progress Toward Goal: Progressing  Short-term Objective addressed today:[name] will be able to implement more effective communication with her family members within 6 months. Mental health techniques used: Objective was addressed in session through the use of Cognitive Behavioral Therapy and Assertiveness/Communication and discussion. Fallen's response was positive.  Progress Toward Goal: Progressing    PLAN  1. Justis  will return for a therapy session.   2. 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 This homework will be reviewed with [name] at the next visit.  3. During the next session check in on mood and anxiety, life stress, conversation with spouse.  [name], PhD     Individual Treatment Plan - please see the note from 10/22/2021 for complete treatment plan  information  Problem/Need: Anxiety - [name] experiences anxiety which is exacerbated by the hectic schedule and her expectations of herself.   Long-Term Goal #1: Reduce overall level, frequency, and intensity of the feelings of anxiety evidenced by decreased anxiety symptoms and stress level per client report  Short-Term Objectives: Objective 1A: [name] will be able to measure her anxiety and stress level within 1 month.  Objective 1B: [name] will be able to describe the link between thoughts-feelings-and actions and identify the major components of her anxiety/stress within 3 months. Objective 1C: [name] will be able to identify negative thoughts that are contributing to or perpetuating anxiety/stress and identify at least one alternative thought within 6 months. (Verbalize an understanding of the role that distorted thinking plays in creating fears, excessive worry, and ruminations). Objective 1D: Develop a range of coping mechanisms to address negative thinking patterns and physiological symptoms of anxiety.  Interventions: Cognitive Behavioral Therapy, Motivational Interviewing, and Psycho-education/Bibliotherapy  coping skills, and other evidenced-based practices will be used to promote progress towards healthy functioning and to help manage decrease symptoms associated with their diagnosis.  Treatment Regimen: Individual  skill building sessions every 2-3 weeks for addressing treatment goal/objective Target Date: 06/2022 Responsible Party: therapist and patient Person delivering treatment: Licensed Psychologist [name], PhD will support the patient's ability to achieve the goals identified. Resolved: No  Problem/Need: communication challenge - [name] has had some challenges with communicating with her family members.  Long-Term Goal #2: Improve communication between [place] and her family members.  Short-Term Objectives: Objective 2A: [name] will be able to identify the  goals of her communication with her family members.  Objective 2B: [name] will be able to identify breakdowns in communication within 4 months. Objective 2C: [name] will be able to implement more effective communication with her family members within 6 months. Interventions: Cognitive Behavioral Therapy, Assertiveness/Communication, and Motivational Interviewing , and other evidenced-based practices will be used to promote progress towards healthy functioning and to help manage decrease symptoms associated with their diagnosis.  Treatment Regimen: Individual  skill building sessions every 2-3 weeks to address treatment goal/objective Target Date: 06/2022 Responsible Party: therapist and patient Person delivering treatment: Licensed Psychologist [name], PhD will support the patient's ability to achieve the goals identified. Resolved: No  Problem/Need: Grief - Esra experienced the sudden loss of her father between the intake and initial therapy visit.  Long-Term Goal #3: Alizandra will be able to process her grief.  Short-Term Objectives: Objective 3A: Vaness will be able to talk about her feelings related to grief.  Objective 3B: Wallace will be able to identify and make use of supports to help her process her grief and manage her emotions.  Interventions: Cognitive Behavioral Therapy, Motivational Interviewing, and Grief Therapy , and other evidenced-based practices will be used to promote progress towards healthy functioning and to help manage decrease symptoms associated with their diagnosis.  Treatment Regimen: Individual  skill building sessions every 2-3 weeks to address treatment goal/objective Target Date: 06/2022 Responsible Party: therapist and patient Person delivering treatment: Licensed Psychologist [name], PhD will support the patient's ability to achieve the goals identified. Resolved: No  [name], PhD

## 2022-05-06 ENCOUNTER — Ambulatory Visit: Payer: No Typology Code available for payment source | Admitting: Clinical

## 2022-05-13 ENCOUNTER — Ambulatory Visit (INDEPENDENT_AMBULATORY_CARE_PROVIDER_SITE_OTHER): Payer: No Typology Code available for payment source | Admitting: Clinical

## 2022-05-13 DIAGNOSIS — F419 Anxiety disorder, unspecified: Secondary | ICD-10-CM

## 2022-05-13 NOTE — Progress Notes (Signed)
[facility] Counselor/Therapist Progress Note  Patient ID: Lauren Guerrero, MRN: [id number]    Date: 05/13/22  Time Spent: 9:00 am - 10:00 am: 60 Minutes  Type of Service Provided Individual Therapy  Type of Contact virtual (via [place] with real time audio and visual interaction)  Patient Location: home       Provider Location: office  Lauren Guerrero participated from home, via video, and consented to treatment. Therapist participated from office.   Mental Status Exam: Appearance:  Fairly Groomed     Behavior: Sharing  Motor: Normal  Speech/Language:  Normal Rate  Affect: Tearful intermittently  Mood: sad to neutral  Thought process: normal  Thought content:   WNL  Sensory/Perceptual disturbances:   WNL  Orientation: oriented to person, place, time/date, and situation  Attention: Good  Concentration: Good  Memory: WNL  Fund of knowledge:  Fair  Insight:   Fair  Judgment:  Good  Impulse Control: Good   Risk Assessment: No apparent indicators of HI or SI during the visit   Presenting Problems, Reported Symptoms, and /or Interim History: Lauren Guerrero presented for a session focused on grief  Subjective: Lauren Guerrero presented for an individual outpatient therapy session. The following was addressed during sessions.   Lauren Guerrero reported that she has experienced an increase in feelings of sadness related to the loss of her father.  She described her mood since the last visit as variable, ranging from sad to neutral to happy.  She described that she was not experiencing Guerrero but did describe feeling stress.  Although she focused on trying to move past her negative feelings. the idea of grief being an ongoing process was normalized along with discussing taking time to accept and address her feelings as well as continuing to complete her daily tasks.  Strategies she could use to try to process some of her feelings of grief were discussed.  During session, Lauren Guerrero talked  about various things that she was feeling and things that she wished she had said.  Ways to recruit her support system to help her at this time along with expectations that the holidays can be challenging for many people that are grieving were discussed.  Depression was briefly screened for and she denied several symptoms of depression.  She did report feeling some sadness and some difficulty sleeping.  Interventions/Psychotherapy Techniques Used During Session: Grief Therapy and Psycho-education/Bibliotherapy  Diagnosis: Guerrero  MENTAL HEALTH INTERVENTIONS USED DURING TREATMENT & PATIENT'S RESPONSE TO INTERVENTIONS:  Short-term Objective addressed today:Lauren Guerrero will be able to talk about her feelings related to grief. AND Lauren Guerrero will be able to identify and make use of supports to help her process her grief and manage her emotions.  Mental health techniques used: Objective was addressed in session through the use of Grief Therapy and Psycho-education/Bibliotherapy, and discussion. Lauren Guerrero's response was generally positive though she was intermittently sad throughout the majority of the visit.  Progress Toward Goal: Progressing    PLAN  1. Lauren Guerrero will return for a therapy session.   2. Homework Given: Allow space and time when she is able to experience her negative and sad feelings, recruit her support system when needed, consider a grief group if grief continues to escalate or has a larger impact on day-to-day functioning. This homework will be reviewed with Lauren Guerrero at the next visit.  3. During the next session check in on emotions, Guerrero, grief processing.     [name], PhD    Individual Treatment Plan - please see  the note from 10/22/2021 for complete treatment plan information  Problem/Need: Guerrero - Lauren Guerrero which is exacerbated by the hectic schedule and her expectations of herself.   Long-Term Goal #1: Reduce overall level, frequency, and intensity  of the feelings of Guerrero evidenced by decreased Guerrero symptoms and stress level per client report  Short-Term Objectives: Objective 1A: Lauren Guerrero will be able to measure her Guerrero and stress level within 1 month.  Objective 1B: Lauren Guerrero will be able to describe the link between thoughts-feelings-and actions and identify the major components of her Guerrero/stress within 3 months. Objective 1C: Lauren Guerrero will be able to identify negative thoughts that are contributing to or perpetuating Guerrero/stress and identify at least one alternative thought within 6 months. (Verbalize an understanding of the role that distorted thinking plays in creating fears, excessive worry, and ruminations). Objective 1D: Develop a range of coping mechanisms to address negative thinking patterns and physiological symptoms of Guerrero.  Interventions: Cognitive Behavioral Therapy, Motivational Interviewing, and Psycho-education/Bibliotherapy  coping skills, and other evidenced-based practices will be used to promote progress towards healthy functioning and to help manage decrease symptoms associated with their diagnosis.  Treatment Regimen: Individual  skill building sessions every 2-3 weeks for addressing treatment goal/objective Target Date: 06/2022 Responsible Party: therapist and patient Person delivering treatment: Licensed Psychologist [name], PhD will support the patient's ability to achieve the goals identified. Resolved: No  Problem/Need: communication challenge - Lauren Guerrero has had some challenges with communicating with her family members.  Long-Term Goal #2: Improve communication between Lauren Guerrero and her family members.  Short-Term Objectives: Objective 2A: Lauren Guerrero will be able to identify the goals of her communication with her family members.  Objective 2B: Lauren Guerrero will be able to identify breakdowns in communication within 4 months. Objective 2C: Lauren Guerrero will be able to implement more effective  communication with her family members within 6 months. Interventions: Cognitive Behavioral Therapy, Assertiveness/Communication, and Motivational Interviewing , and other evidenced-based practices will be used to promote progress towards healthy functioning and to help manage decrease symptoms associated with their diagnosis.  Treatment Regimen: Individual  skill building sessions every 2-3 weeks to address treatment goal/objective Target Date: 06/2022 Responsible Party: therapist and patient Person delivering treatment: Licensed Psychologist [name], PhD will support the patient's ability to achieve the goals identified. Resolved: No  Problem/Need: Grief - Mallary experienced the sudden loss of her father between the intake and initial therapy visit.  Long-Term Goal #3: Ayvah will be able to process her grief.  Short-Term Objectives: Objective 3A: Laneka will be able to talk about her feelings related to grief.  Objective 3B: Junnie will be able to identify and make use of supports to help her process her grief and manage her emotions.  Interventions: Cognitive Behavioral Therapy, Motivational Interviewing, and Grief Therapy , and other evidenced-based practices will be used to promote progress towards healthy functioning and to help manage decrease symptoms associated with their diagnosis.  Treatment Regimen: Individual  skill building sessions every 2-3 weeks to address treatment goal/objective Target Date: 06/2022 Responsible Party: therapist and patient Person delivering treatment: Licensed Psychologist [name], PhD will support the patient's ability to achieve the goals identified. Resolved: No  [name], PhD

## 2022-05-20 ENCOUNTER — Encounter: Payer: Self-pay | Admitting: Podiatry

## 2022-05-20 ENCOUNTER — Ambulatory Visit (INDEPENDENT_AMBULATORY_CARE_PROVIDER_SITE_OTHER): Payer: No Typology Code available for payment source | Admitting: Podiatry

## 2022-05-20 DIAGNOSIS — N63 Unspecified lump in unspecified breast: Secondary | ICD-10-CM | POA: Insufficient documentation

## 2022-05-20 DIAGNOSIS — M79672 Pain in left foot: Secondary | ICD-10-CM

## 2022-05-20 DIAGNOSIS — M79671 Pain in right foot: Secondary | ICD-10-CM | POA: Diagnosis not present

## 2022-05-20 DIAGNOSIS — D259 Leiomyoma of uterus, unspecified: Secondary | ICD-10-CM | POA: Insufficient documentation

## 2022-05-20 DIAGNOSIS — Q828 Other specified congenital malformations of skin: Secondary | ICD-10-CM

## 2022-05-20 DIAGNOSIS — O99019 Anemia complicating pregnancy, unspecified trimester: Secondary | ICD-10-CM | POA: Insufficient documentation

## 2022-05-20 DIAGNOSIS — O200 Threatened abortion: Secondary | ICD-10-CM | POA: Insufficient documentation

## 2022-05-20 DIAGNOSIS — D573 Sickle-cell trait: Secondary | ICD-10-CM | POA: Insufficient documentation

## 2022-05-20 NOTE — Patient Instructions (Signed)
Look for urea 40% + 2% salicylic cream or ointment and apply to the thickened dry skin / calluses. This can be bought over the counter, at a pharmacy or online such as [employer]. This is good for regular maintenance of these. To start, I would use a corn/callus remover like Dr Felicie Morn pads to remove the deeper core of the area   For inserts, I like Protalus, [employer], [employer], and [employer]. Try these first for the flat feet. A custom orthotic may last longer or give more support but may not be necessary right away

## 2022-05-21 NOTE — Progress Notes (Signed)
  Subjective:  Patient ID: Lauren Guerrero, female    DOB: Jun 13, 1976,  MRN: [id number]  Chief Complaint  Patient presents with   Callouses    Plantar/lateral bilateral - small, callused area x several months, same area each foot, tender if thickened or working out, tried trimming   New Patient (Initial Visit)    46 y.o. female presents with the above complaint. History confirmed with patient.   Objective:  Physical Exam: warm, good capillary refill, no trophic changes or ulcerative lesions, normal DP and PT pulses, normal sensory exam, and painful porokeratosis plantar midfoot bilateral.  Assessment:   1. Porokeratosis   2. Pain in both feet      Plan:  Patient was evaluated and treated and all questions answered.  All symptomatic hyperkeratoses were safely debrided with a sterile # 312 blade to patient's level of comfort without incident. We discussed preventative and palliative care of these lesions including supportive and accommodative shoegear, padding, prefabricated and custom molded accommodative orthoses, use of a pumice stone and lotions/creams daily.  Recommended use of urea cream with salicylic acid which she will get   Return if symptoms worsen or fail to improve.

## 2022-05-29 ENCOUNTER — Ambulatory Visit (INDEPENDENT_AMBULATORY_CARE_PROVIDER_SITE_OTHER): Payer: No Typology Code available for payment source | Admitting: Clinical

## 2022-05-29 DIAGNOSIS — F419 Anxiety disorder, unspecified: Secondary | ICD-10-CM | POA: Diagnosis not present

## 2022-05-29 NOTE — Progress Notes (Signed)
[facility] Counselor/Therapist Progress Note  Patient ID: Lauren Guerrero, MRN: [id number]    Date: 05/29/22  Time Spent: 9:01 am - 10:01 am: 60 Minutes  Type of Service Provided Individual Therapy  Type of Contact virtual (via [place] with real time audio and visual interaction)  Patient Location: home       Provider Location: office  Colletta [place] Kapfer participated from home, via video, and consented to treatment. Therapist participated from office.    Mental Status Exam: Appearance:  Neat     Behavior: Appropriate and Sharing  Motor: Normal  Speech/Language:  Clear and Coherent  Affect: Tearful  Mood: sad at times   Thought process: normal  Thought content:   WNL  Sensory/Perceptual disturbances:   WNL  Orientation: oriented to person, place, time/date, and situation  Attention: Good  Concentration: Good  Memory: WNL  Fund of knowledge:  Good  Insight:   Fair  Judgment:  Good  Impulse Control: Good   Risk Assessment: no apparent indicators of HI or SI during visit  Presenting Problems, Reported Symptoms, and /or Interim History: Sharolyn presented for a session addressing grief and anxiety.   Subjective: [name] presented for an individual outpatient therapy session. The following was addressed during sessions.   [name] reported that overall her mood was not as bad as it was during the last visit but she has continued to feel down at times and experience of anxiety.  She rated her overall mood as ranging from 5-9 in the last few days (with 1 being sad, 5 being neutral, 10 being happy) and described her anxiety as a 6-7 out of 10 (with 10 being high).  She reported that she also feels as though she is moving in "slow motion" and is losing time or zoning out during tasks.  Much of this is related to grief associated with the loss of her father and her focus on the loss of the potential relationship they could have had and trying to make sense of her  childhood experiences.  This was discussed with [name] being encouraged to explore her feelings, as she tends to try to intellectualize or rationalize things, or "push down" her feelings.  Therapist continued to encourage [name] to further explore her emotions and expand her emotional vocabulary.  Interventions/Psychotherapy Techniques Used During Session: Grief Therapy  Diagnosis: Anxiety  MENTAL HEALTH INTERVENTIONS USED DURING TREATMENT & PATIENT'S RESPONSE TO INTERVENTIONS:  Short-term Objective addressed today:[name] will be able to talk about her feelings related to grief. AND [name] will be able to identify and make use of supports to help her process her grief and manage her emotions.  Mental health techniques used: Objective was addressed in session through the use of  Grief Therapy and discussion. [name]'s response to the discussion was positive.  Progress Toward Goal: Progressing, though it is still hard for her to talk about her feelings as opposed to her thoughts and beliefs about situations.    PLAN  1. [name]  will return for a therapy session.   2. Homework Given: Start a feelings journal and spend 1 to 2 minutes a day writing down her feelings, start looking at feelings charts online to expand her emotional vocabulary. This homework will be reviewed with [name] at the next visit.  3. During the next session check in on grief, mood, zoning out, anxiety.     [name], PhD    Individual Treatment Plan - please see the note from 10/22/2021 for complete treatment  plan information  Problem/Need: Anxiety - [name] experiences anxiety which is exacerbated by the hectic schedule and her expectations of herself.   Long-Term Goal #1: Reduce overall level, frequency, and intensity of the feelings of anxiety evidenced by decreased anxiety symptoms and stress level per client report  Short-Term Objectives: Objective 1A: [name] will be able to measure her anxiety  and stress level within 1 month.  Objective 1B: [name] will be able to describe the link between thoughts-feelings-and actions and identify the major components of her anxiety/stress within 3 months. Objective 1C: [name] will be able to identify negative thoughts that are contributing to or perpetuating anxiety/stress and identify at least one alternative thought within 6 months. (Verbalize an understanding of the role that distorted thinking plays in creating fears, excessive worry, and ruminations). Objective 1D: Develop a range of coping mechanisms to address negative thinking patterns and physiological symptoms of anxiety.  Interventions: Cognitive Behavioral Therapy, Motivational Interviewing, and Psycho-education/Bibliotherapy  coping skills, and other evidenced-based practices will be used to promote progress towards healthy functioning and to help manage decrease symptoms associated with their diagnosis.  Treatment Regimen: Individual  skill building sessions every 2-3 weeks for addressing treatment goal/objective Target Date: 06/2022 Responsible Party: therapist and patient Person delivering treatment: Licensed Psychologist [name], PhD will support the patient's ability to achieve the goals identified. Resolved: No  Problem/Need: communication challenge - [name] has had some challenges with communicating with her family members.  Long-Term Goal #2: Improve communication between [place] and her family members.  Short-Term Objectives: Objective 2A: [name] will be able to identify the goals of her communication with her family members.  Objective 2B: [name] will be able to identify breakdowns in communication within 4 months. Objective 2C: [name] will be able to implement more effective communication with her family members within 6 months. Interventions: Cognitive Behavioral Therapy, Assertiveness/Communication, and Motivational Interviewing , and other evidenced-based  practices will be used to promote progress towards healthy functioning and to help manage decrease symptoms associated with their diagnosis.  Treatment Regimen: Individual  skill building sessions every 2-3 weeks to address treatment goal/objective Target Date: 06/2022 Responsible Party: therapist and patient Person delivering treatment: Licensed Psychologist [name], PhD will support the patient's ability to achieve the goals identified. Resolved: No  Problem/Need: Grief - Salaya experienced the sudden loss of her father between the intake and initial therapy visit.  Long-Term Goal #3: Marijose will be able to process her grief.  Short-Term Objectives: Objective 3A: Jennine will be able to talk about her feelings related to grief.  Objective 3B: Nathan will be able to identify and make use of supports to help her process her grief and manage her emotions.  Interventions: Cognitive Behavioral Therapy, Motivational Interviewing, and Grief Therapy , and other evidenced-based practices will be used to promote progress towards healthy functioning and to help manage decrease symptoms associated with their diagnosis.  Treatment Regimen: Individual  skill building sessions every 2-3 weeks to address treatment goal/objective Target Date: 06/2022 Responsible Party: therapist and patient Person delivering treatment: Licensed Psychologist [name], PhD will support the patient's ability to achieve the goals identified. Resolved: No  [name], PhD

## 2022-06-10 ENCOUNTER — Ambulatory Visit (INDEPENDENT_AMBULATORY_CARE_PROVIDER_SITE_OTHER): Payer: No Typology Code available for payment source | Admitting: Family Medicine

## 2022-06-10 ENCOUNTER — Telehealth: Payer: Self-pay

## 2022-06-10 ENCOUNTER — Ambulatory Visit (INDEPENDENT_AMBULATORY_CARE_PROVIDER_SITE_OTHER): Payer: No Typology Code available for payment source | Admitting: Clinical

## 2022-06-10 ENCOUNTER — Encounter: Payer: Self-pay | Admitting: Family Medicine

## 2022-06-10 VITALS — BP 128/84 | HR 98 | Temp 98.1°F | Resp 17 | Ht 65.0 in | Wt 162.0 lb

## 2022-06-10 DIAGNOSIS — R202 Paresthesia of skin: Secondary | ICD-10-CM

## 2022-06-10 DIAGNOSIS — R200 Anesthesia of skin: Secondary | ICD-10-CM | POA: Diagnosis not present

## 2022-06-10 DIAGNOSIS — F419 Anxiety disorder, unspecified: Secondary | ICD-10-CM

## 2022-06-10 LAB — HEPATIC FUNCTION PANEL
ALT: 17 U/L (ref 0–35)
AST: 22 U/L (ref 0–37)
Albumin: 4.4 g/dL (ref 3.5–5.2)
Alkaline Phosphatase: 44 U/L (ref 39–117)
Bilirubin, Direct: 0.1 mg/dL (ref 0.0–0.3)
Total Bilirubin: 0.4 mg/dL (ref 0.2–1.2)
Total Protein: 7.3 g/dL (ref 6.0–8.3)

## 2022-06-10 LAB — CBC WITH DIFFERENTIAL/PLATELET
Basophils Absolute: 0 10*3/uL (ref 0.0–0.1)
Basophils Relative: 0.8 % (ref 0.0–3.0)
Eosinophils Absolute: 0 10*3/uL (ref 0.0–0.7)
Eosinophils Relative: 0.9 % (ref 0.0–5.0)
HCT: 38.9 % (ref 36.0–46.0)
Hemoglobin: 12.7 g/dL (ref 12.0–15.0)
Lymphocytes Relative: 37.5 % (ref 12.0–46.0)
Lymphs Abs: 1.7 10*3/uL (ref 0.7–4.0)
MCHC: 32.8 g/dL (ref 30.0–36.0)
MCV: 86.2 fl (ref 78.0–100.0)
Monocytes Absolute: 0.2 10*3/uL (ref 0.1–1.0)
Monocytes Relative: 5.2 % (ref 3.0–12.0)
Neutro Abs: 2.5 10*3/uL (ref 1.4–7.7)
Neutrophils Relative %: 55.6 % (ref 43.0–77.0)
Platelets: 248 10*3/uL (ref 150.0–400.0)
RBC: 4.51 Mil/uL (ref 3.87–5.11)
RDW: 14.5 % (ref 11.5–15.5)
WBC: 4.5 10*3/uL (ref 4.0–10.5)

## 2022-06-10 LAB — LIPID PANEL
Cholesterol: 159 mg/dL (ref 0–200)
HDL: 66.7 mg/dL (ref >39.00)
LDL Cholesterol: 80 mg/dL (ref 0–99)
NonHDL: 91.86
Total CHOL/HDL Ratio: 2
Triglycerides: 59 mg/dL (ref 0.0–149.0)
VLDL: 11.8 mg/dL (ref 0.0–40.0)

## 2022-06-10 LAB — VITAMIN D 25 HYDROXY (VIT D DEFICIENCY, FRACTURES): VITD: 49.94 ng/mL (ref 30.00–100.00)

## 2022-06-10 LAB — BASIC METABOLIC PANEL
BUN: 13 mg/dL (ref 6–23)
CO2: 29 mEq/L (ref 19–32)
Calcium: 9.1 mg/dL (ref 8.4–10.5)
Chloride: 103 mEq/L (ref 96–112)
Creatinine, Ser: 0.78 mg/dL (ref 0.40–1.20)
GFR: 91.69 mL/min (ref >60.00)
Glucose, Bld: 114 mg/dL — ABNORMAL HIGH (ref 70–99)
Potassium: 3.8 mEq/L (ref 3.5–5.1)
Sodium: 136 mEq/L (ref 135–145)

## 2022-06-10 LAB — B12 AND FOLATE PANEL
Folate: 18.6 ng/mL (ref >5.9)
Vitamin B-12: 386 pg/mL (ref 211–911)

## 2022-06-10 LAB — HEMOGLOBIN A1C: Hgb A1c MFr Bld: 5.8 % (ref 4.6–6.5)

## 2022-06-10 LAB — TSH: TSH: 1.27 u[IU]/mL (ref 0.35–5.50)

## 2022-06-10 LAB — MAGNESIUM: Magnesium: 2 mg/dL (ref 1.5–2.5)

## 2022-06-10 NOTE — Progress Notes (Signed)
   Subjective:    Patient ID: Lauren Guerrero, female    DOB: 04-20-1976, 46 y.o.   MRN: [id number]  HPI Tingling hands/feet- pt reports tingling will occur prior to bed or when running around doing errands.  Sxs have been off and on since Feb but pt would have long periods of resolution.  Has just noticed the sxs returned in the last few weeks.  Pt has some positional sxs that will improve w/ changing positions or 'shaking it out'.  More recently, sxs have been predominately R sided.  Pt is R hand dominant.  Tingling is mostly wrist and hands.  Sxs were predominately in the feet until recently.     Review of Systems For ROS see HPI     Objective:   Physical Exam Vitals reviewed.  Constitutional:      General: She is not in acute distress.    Appearance: Normal appearance. She is not ill-appearing.  HENT:     Head: Normocephalic and atraumatic.  Eyes:     Extraocular Movements: Extraocular movements intact.     Conjunctiva/sclera: Conjunctivae normal.     Pupils: Pupils are equal, round, and reactive to light.  Cardiovascular:     Rate and Rhythm: Normal rate and regular rhythm.  Pulmonary:     Effort: Pulmonary effort is normal. No respiratory distress.  Skin:    General: Skin is warm and dry.  Neurological:     General: No focal deficit present.     Mental Status: She is alert and oriented to person, place, and time.     Cranial Nerves: No cranial nerve deficit.     Motor: No weakness.     Coordination: Coordination normal.     Gait: Gait normal.  Psychiatric:     Comments: anxious           Assessment & Plan:   Numbness/tingling of both hands and feet- deteriorated.  Sxs started earlier this year but she would have long periods of resolution.  Recently sxs returned and are more frequent.  Sxs started in feet but now seem to be more in hands and wrists- predominately on R side.  She can shake out her hands/feet and numbness improves.  Will check labs to r/o underlying  cause and refer to Neuro for complete evaluation/tx.  Pt expressed understanding and is in agreement w/ plan.

## 2022-06-10 NOTE — Progress Notes (Signed)
Central Counselor/Therapist Progress Note  Patient ID: Lauren Guerrero, MRN: [id number]    Date: 06/10/22  Time Spent: 9:01 am - 10:01 am: 60 Minutes  Type of Service Provided Individual Therapy  Type of Contact virtual (via [place] with real time audio and visual interaction)  Patient Location: home       Provider Location: office  Colletta [place] Flanagan participated from home, via video, and consented to treatment. Therapist participated from office.   Mental Status Exam: Appearance:  Casual and Well Groomed     Behavior: Sharing  Motor: Normal  Speech/Language:  Clear and Coherent and Normal Rate  Affect: Appropriate  Mood: normal  Thought process: normal  Thought content:   WNL  Sensory/Perceptual disturbances:   WNL  Orientation: oriented to person, place, time/date, and situation  Attention: Good  Concentration: Good  Memory: WNL  Fund of knowledge:  Good  Insight:   Fair  Judgment:  Good  Impulse Control: Good   Risk Assessment: Mo apparent indicators of SI or HI during the visit  Presenting Problems, Reported Symptoms, and /or Interim History: Bristyl presented for a visit to address anxiety and communication  Subjective: Kylieann presented for an individual outpatient therapy session. The following was addressed during sessions.   Deairra reported that things have been going a bit better since the last visit.  She rated her mood as between a 5 and a 9 in the last week (with 1 being sad, 5 being neutral, and 10 being happy).  She reported that she has been experiencing some anxiety with scheduling but has been better able to manage it since she and the therapist discussed different ways to set up her routine.  She reported that her zoning out has been a bit reduced.  She had a conversation with her spouse about some of her holding back and what that is related to and described this as a "huge relief".  The conversation was reported to be productive and  she felt that he was supportive and understanding.  How to negotiate about having her family come for the holidays with setting boundaries and helping her to maintain her progress was discussed. 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  She reported that many of her feelings have ended up in the fearful area (scared, anxious, insecure, rejected, and adequate) and in the frustrated area.  How this relates to her communication and anxiety were discussed.  Interventions/Psychotherapy Techniques Used During Session: Cognitive Behavioral Therapy, Assertiveness/Communication, and Insight-Oriented  Diagnosis: Anxiety  MENTAL HEALTH INTERVENTIONS USED DURING TREATMENT & PATIENT'S RESPONSE TO INTERVENTIONS:  Short-term Objective addressed today: Emmersyn will be able to implement more effective communication with her family members within 6 months. Mental health techniques used: Objective was addressed in session through the use of  Assertiveness/Communication and Insight-Oriented, and discussion.OFlorence's response was positive.  Progress Toward Goal: Progressing  Short-term Objective addressed today:Develop a range of coping mechanisms to address negative thinking patterns and physiological symptoms of anxiety.  Mental health techniques used: objective was addressed in session through the use of Cognitive Behavioral Therapy and discussion.  Kristee's response was positive.  Progress Toward Goal: Progressing     PLAN  1. Natori will return for a therapy session.   2. Homework Given: Begin to have the discussion with her family members about them visiting for the holidays and determine if this is  something that she wants to have happen, continue to check in on her feelings and communicate with 'I feel' statements. This homework will be reviewed with [name] at the next visit.  3. During the  next session check in on communication, grief, mood, anxiety.     [name], PhD     Individual Treatment Plan - please see the note from 10/22/2021 for complete treatment plan information  Problem/Need: Anxiety - Darianny experiences anxiety which is exacerbated by the hectic schedule and her expectations of herself.   Long-Term Goal #1: Reduce overall level, frequency, and intensity of the feelings of anxiety evidenced by decreased anxiety symptoms and stress level per client report  Short-Term Objectives: Objective 1A: Oumou will be able to measure her anxiety and stress level within 1 month.  Objective 1B: [place] will be able to describe the link between thoughts-feelings-and actions and identify the major components of her anxiety/stress within 3 months. Objective 1C: Jazman will be able to identify negative thoughts that are contributing to or perpetuating anxiety/stress and identify at least one alternative thought within 6 months. (Verbalize an understanding of the role that distorted thinking plays in creating fears, excessive worry, and ruminations). Objective 1D: Develop a range of coping mechanisms to address negative thinking patterns and physiological symptoms of anxiety.  Interventions: Cognitive Behavioral Therapy, Motivational Interviewing, and Psycho-education/Bibliotherapy  coping skills, and other evidenced-based practices will be used to promote progress towards healthy functioning and to help manage decrease symptoms associated with their diagnosis.  Treatment Regimen: Individual  skill building sessions every 2-3 weeks for addressing treatment goal/objective Target Date: 06/2022 Responsible Party: therapist and patient Person delivering treatment: Licensed Psychologist [name], PhD will support the patient's ability to achieve the goals identified. Resolved: No  Problem/Need: communication challenge - Carrol has had some challenges with communicating  with her family members.  Long-Term Goal #2: Improve communication between [place] and her family members.  Short-Term Objectives: Objective 2A: Zahli will be able to identify the goals of her communication with her family members.  Objective 2B: Margot will be able to identify breakdowns in communication within 4 months. Objective 2C: Sheryl will be able to implement more effective communication with her family members within 6 months. Interventions: Cognitive Behavioral Therapy, Assertiveness/Communication, and Motivational Interviewing , and other evidenced-based practices will be used to promote progress towards healthy functioning and to help manage decrease symptoms associated with their diagnosis.  Treatment Regimen: Individual  skill building sessions every 2-3 weeks to address treatment goal/objective Target Date: 06/2022 Responsible Party: therapist and patient Person delivering treatment: Licensed Psychologist [name], PhD will support the patient's ability to achieve the goals identified. Resolved: No  Problem/Need: Grief - Soriah experienced the sudden loss of her father between the intake and initial therapy visit.  Long-Term Goal #3: Shaquira will be able to process her grief.  Short-Term Objectives: Objective 3A: Noelle will be able to talk about her feelings related to grief.  Objective 3B: Anahla will be able to identify and make use of supports to help her process her grief and manage her emotions.  Interventions: Cognitive Behavioral Therapy, Motivational Interviewing, and Grief Therapy , and other evidenced-based practices will be used to promote progress towards healthy functioning and to help manage decrease symptoms associated with their diagnosis.  Treatment Regimen: Individual  skill building sessions every 2-3 weeks to address treatment goal/objective Target Date: 06/2022 Responsible Party: therapist and patient Person delivering treatment:  Licensed Psychologist [name], PhD will support the patient's  ability to achieve the goals identified. Resolved: No  [name], PhD

## 2022-06-10 NOTE — Patient Instructions (Addendum)
Follow up as needed or as scheduled We'll notify you of your lab results and make any changes if needed We'll call you to schedule your Neurology appt Call with any questions or concerns Stay Safe!  Stay Healthy! Happy Holidays!!

## 2022-06-11 ENCOUNTER — Telehealth: Payer: Self-pay

## 2022-06-11 NOTE — Telephone Encounter (Signed)
-----   Message from Midge Minium, MD sent at 06/11/2022  7:41 AM EST ----- Labs look great!!  Iron is at the lower end of normal so I would start a daily OTC supplement and see if this helps with some of your numbness/tingling  Sugar is elevated but you were not fasting and your A1C is excellent- no evidence of diabetes!  No obvious cause to explain your symptoms so we will proceed with the neuro referral as planned

## 2022-06-11 NOTE — Telephone Encounter (Signed)
Informed pt of lab results  

## 2022-06-14 LAB — IRON,TIBC AND FERRITIN PANEL
%SAT: 18 % (calc) (ref 16–45)
Ferritin: 6 ng/mL — ABNORMAL LOW (ref 16–232)
Iron: 63 ug/dL (ref 40–190)
TIBC: 344 mcg/dL (calc) (ref 250–450)

## 2022-06-14 LAB — VITAMIN B1: Vitamin B1 (Thiamine): 8 nmol/L (ref 8–30)

## 2022-06-16 ENCOUNTER — Ambulatory Visit (INDEPENDENT_AMBULATORY_CARE_PROVIDER_SITE_OTHER): Payer: No Typology Code available for payment source | Admitting: Family Medicine

## 2022-06-16 ENCOUNTER — Encounter: Payer: Self-pay | Admitting: Family Medicine

## 2022-06-16 VITALS — BP 112/78 | HR 88 | Temp 97.9°F | Resp 18 | Ht 65.0 in | Wt 160.1 lb

## 2022-06-16 DIAGNOSIS — Z0000 Encounter for general adult medical examination without abnormal findings: Secondary | ICD-10-CM

## 2022-06-16 NOTE — Patient Instructions (Signed)
Follow up in 1 year or as needed Your labs look great!  Keep up the good work! Call with any questions or concerns Stay Safe!  Stay Healthy! Happy Holidays!!!

## 2022-06-16 NOTE — Assessment & Plan Note (Signed)
Pt's PE WNL.  Reviewed recent labs.  All WNL.  UTD on pap, mammo, colonoscopy.  Anticipatory guidance provided.

## 2022-06-16 NOTE — Progress Notes (Signed)
   Subjective:    Patient ID: Lauren Guerrero, female    DOB: [birth year], 46 y.o.   MRN: [id number]  HPI CPE- UTD on pap, mammo, colonoscopy  Patient Care Team    Relationship Specialty Notifications Start End  Midge Minium, MD PCP - General Family Medicine  10/05/17   Bobbye Charleston, MD Consulting Physician Obstetrics and Gynecology  10/05/17     Health Maintenance  Topic Date Due   PAP SMEAR-Modifier  03/25/2025   COLONOSCOPY (Pts 45-23yr Insurance coverage will need to be confirmed)  02/14/2027   INFLUENZA VACCINE  Completed   HIV Screening  Completed   HPV VACCINES  Aged Out   COVID-19 Vaccine  Discontinued   Hepatitis C Screening  Discontinued      Review of Systems Patient reports no vision/ hearing changes, adenopathy,fever, weight change,  persistant/recurrent hoarseness , swallowing issues, chest pain, palpitations, edema, persistant/recurrent cough, hemoptysis, dyspnea (rest/exertional/paroxysmal nocturnal), gastrointestinal bleeding (melena, rectal bleeding), abdominal pain, significant heartburn, bowel changes, GU symptoms (dysuria, hematuria, incontinence), Gyn symptoms (abnormal  bleeding, pain),  syncope, focal weakness, memory loss, skin/hair/nail changes, abnormal bruising or bleeding, anxiety, or depression.     Objective:   Physical Exam General Appearance:    Alert, cooperative, no distress, appears stated age  Head:    Normocephalic, without obvious abnormality, atraumatic  Eyes:    PERRL, conjunctiva/corneas clear, EOM's intact both eyes  Ears:    Normal TM's and external ear canals, both ears  Nose:   Nares normal, septum midline, mucosa normal, no drainage    or sinus tenderness  Throat:   Lips, mucosa, and tongue normal; teeth and gums normal  Neck:   Supple, symmetrical, trachea midline, no adenopathy;    Thyroid: no enlargement/tenderness/nodules  Back:     Symmetric, no curvature, ROM normal, no CVA tenderness  Lungs:     Clear to  auscultation bilaterally, respirations unlabored  Chest Wall:    No tenderness or deformity   Heart:    Regular rate and rhythm, S1 and S2 normal, no murmur, rub   or gallop  Breast Exam:    Deferred to GYN  Abdomen:     Soft, non-tender, bowel sounds active all four quadrants,    no masses, no organomegaly  Genitalia:    Deferred to GYN  Rectal:    Extremities:   Extremities normal, atraumatic, no cyanosis or edema  Pulses:   2+ and symmetric all extremities  Skin:   Skin color, texture, turgor normal, no rashes or lesions.  Hyperpigmented birth mark around L eye  Lymph nodes:   Cervical, supraclavicular, and axillary nodes normal  Neurologic:   CNII-XII intact, normal strength, sensation and reflexes    throughout          Assessment & Plan:

## 2022-06-16 NOTE — Telephone Encounter (Signed)
-----   Message from Midge Minium, MD sent at 06/16/2022  7:46 AM EST ----- Thiamine (B1) is also at low end of normal.  I would add a B-complex vitamin to boost these levels

## 2022-06-16 NOTE — Telephone Encounter (Signed)
Informed pt of lab results  

## 2022-06-25 ENCOUNTER — Encounter: Payer: Self-pay | Admitting: Registered"

## 2022-06-25 ENCOUNTER — Encounter: Payer: No Typology Code available for payment source | Attending: Family Medicine | Admitting: Registered"

## 2022-06-25 DIAGNOSIS — E669 Obesity, unspecified: Secondary | ICD-10-CM | POA: Insufficient documentation

## 2022-06-25 NOTE — Patient Instructions (Signed)
-   Add in lunch to include vegetables.   - Keep up the great work!

## 2022-06-25 NOTE — Progress Notes (Signed)
Medical Nutrition Therapy  Appointment Start time:  9:00  Appointment End time:  9:45  Primary concerns today: recommended meal/snack combinations  Referral diagnosis: obesity Preferred learning style: no preference indicated Learning readiness: ready, change in progress   NUTRITION ASSESSMENT   Pt arrives stating she has been increasing vegetable intake with lunch and dinner. Reports she has started having nuts with fruit to balance snacks better and drinking more water. States she has continued exercise regimen of 4 days/week at local gym. States she has been starting with something savory to eat for breakfast instead of cereal.   Had recent labs done:  Increased A1c 5.8 from 5.7 Improved Vitamin D 49.94 from 27 Improved LDL Chol 80 from 103  Low Vitamin B1 8  Previous visit: Arrives with the 2 children stating she wants overall health and wellness. Reports family history of HTN, prediabetes, colon cancer on paternal side of family. States she was attending aerobic, strength-training, and yoga classes 4-5 days/week and will resume once children are in school again.    Clinical Medical Hx: Vitamin D deficiency Medications: See list Labs: increased A1c from previous visit (5.8 from 5.7), low vitamin B1 (8), improved Vitamin D (49.94), improved LDL Chol (80) Notable Signs/Symptoms: none reported   Lifestyle & Dietary Hx  Estimated daily fluid intake: 80-100 oz Supplements: See list Sleep: 7-8 hrs/night Stress / self-care: eat better, sleep better, exercise Current average weekly physical activity: barre class, aerobic/strength training classes 45-60 min, 4 days/week  24-Hr Dietary Recall First Meal (8-10:30 am): eggs + vegetables + avocado  Snack: banana + orange + nuts Second Meal (1 pm): skips Snack:  Third Meal (8-9 pm): salmon + brussels sprouts + a small amount of rice   Snack: apple + nuts  Beverages: water with lemon (4-5*20 oz; 80-100 oz)   NUTRITION DIAGNOSIS   NB-1.1 Food and nutrition-related knowledge deficit As related to prediabetes.  As evidenced by lack of previous nutrition-related education.   NUTRITION INTERVENTION  Nutrition education (E-1) on the following topics: Pt was encouraged with changes made from previous visit. Discussed vitamin deficiencies and ways to increase Vitamin D and B vitamin intake. Discussed A1c changes amongst other lab changes and educated and counseled on metabolism along with importance of not skipping meals. Pt agreed with goals listed.   Handouts Provided Include  MyPlate for Prediabetes  Learning Style & Readiness for Change Teaching method utilized: Visual & Auditory  Demonstrated degree of understanding via: Teach Back  Barriers to learning/adherence to lifestyle change: none identified  Goals Established by Pt Add in lunch to include vegetables.  Keep up the great work!    MONITORING & EVALUATION Dietary intake, weekly physical activity.  Next Steps  Patient is to follow-up prn.

## 2022-07-01 ENCOUNTER — Ambulatory Visit (INDEPENDENT_AMBULATORY_CARE_PROVIDER_SITE_OTHER): Payer: No Typology Code available for payment source | Admitting: Clinical

## 2022-07-01 DIAGNOSIS — F419 Anxiety disorder, unspecified: Secondary | ICD-10-CM | POA: Diagnosis not present

## 2022-07-01 NOTE — Progress Notes (Signed)
[facility] Counselor/Therapist Progress Note  Patient ID: Lauren Guerrero, MRN: [id number]    Date: 07/01/22  Time Spent: 9:02 am - 10:01 am: 59 Minutes  Type of Service Provided Individual Therapy  Type of Contact virtual (via [place] with real time audio and visual interaction)  Patient Location: home       Provider Location: office  Colletta [place] Angelucci participated from home, via video, and consented to treatment. Therapist participated from office.    Mental Status Exam: Appearance:  Casual     Behavior: Appropriate  Motor: Normal  Speech/Language:  Clear and Coherent and Normal Rate  Affect: Appropriate, at times tearful  Mood: normal to sad  Thought process: normal  Thought content:   WNL  Sensory/Perceptual disturbances:   WNL  Orientation: oriented to person, place, time/date, and situation  Attention: Good  Concentration: Good  Memory: WNL  Fund of knowledge:  Good  Insight:   Good  Judgment:  Good  Impulse Control: Good   Risk Assessment: no apparent SI or HI during visit   Presenting Problems, Reported Symptoms, and /or Interim History: Savera presented for a visit to discuss life stress and plans for upcoming challenging conversations with her mother  Subjective: Cristella presented for an individual outpatient therapy session. The following was addressed during sessions.   Shirin rated her mood as a 5 out of 10 (with 1 being sad, 5 being neutral, and 10 being happy).  She rated her anxiety as a 5 out of 10 (with 10 being high) and indicated that her sleep has been up and down. Brennah reported that she has been engaging in more effortful social behavior and has been feeling good about this.  Practicing talking to others has led to her feeling less anxious about this and next steps for expanding this were discussed.  Andalyn's mother will be coming for Christmas. Kamari reported that she has several significant topics that she would like to  discuss with her mother.  These topics were further discussed and identified and seemed to include: Wanting to know more about her mother's upbringing, wanting her mother to know who she is as a person and how [name] best receives support, and wanting to express and have her mother acknowledge residual negative emotions related to her experiences in childhood.  How to have these conversations were discussed along with helping [name] develop a framework for what she identified as the most potentially challenging conversation (about childhood experiences). Nikkole was encouraged to think about what she wanted out of each conversation and how she would handle various responses by her mother.  She was periodically tearful during these discussions but at the end of the visit seemed to feel more confident about going forward and being able to express what she wanted to express to her parent.  Interventions/Psychotherapy Techniques Used During Session: Cognitive Behavioral Therapy, Assertiveness/Communication, and Insight-Oriented  Diagnosis: Anxiety  MENTAL HEALTH INTERVENTIONS USED DURING TREATMENT & PATIENT'S RESPONSE TO INTERVENTIONS:  Short-term Objective addressed today: Develop a range of coping mechanisms to address negative thinking patterns and physiological symptoms of anxiety.  Mental health techniques used: Objective was addressed in session through the use of Cognitive Behavioral Therapy and discussion. Malay's response was positive.  Progress Toward Goal: Progressing  Short-term Objective addressed today:Weronika will be able to implement more effective communication with her family members within 6 months. Mental health techniques used: Objective was addressed in session through the use of Assertiveness/Communication and Insight-Oriented and discussion. Yarden's response was  positive.  Progress Toward Goal: Progressing    PLAN  1. Naryah  will return for a therapy session.   2.  Homework Given: Spends some time thinking and practicing the conversation that she wants to have with her mother. This homework will be reviewed with [name] at the next visit.  3. During the next session check in on mood, anxiety, conversation.     [name], PhD   Individual Treatment Plan - please see the note from 10/22/2021 for complete treatment plan information  Problem/Need: Anxiety - Natara experiences anxiety which is exacerbated by the hectic schedule and her expectations of herself.   Long-Term Goal #1: Reduce overall level, frequency, and intensity of the feelings of anxiety evidenced by decreased anxiety symptoms and stress level per client report  Short-Term Objectives: Objective 1A: Suzanne will be able to measure her anxiety and stress level within 1 month.  Objective 1B: Therese will be able to describe the link between thoughts-feelings-and actions and identify the major components of her anxiety/stress within 3 months. Objective 1C: Kara will be able to identify negative thoughts that are contributing to or perpetuating anxiety/stress and identify at least one alternative thought within 6 months. (Verbalize an understanding of the role that distorted thinking plays in creating fears, excessive worry, and ruminations). Objective 1D: Develop a range of coping mechanisms to address negative thinking patterns and physiological symptoms of anxiety.  Interventions: Cognitive Behavioral Therapy, Motivational Interviewing, and Psycho-education/Bibliotherapy  coping skills, and other evidenced-based practices will be used to promote progress towards healthy functioning and to help manage decrease symptoms associated with their diagnosis.  Treatment Regimen: Individual  skill building sessions every 2-3 weeks for addressing treatment goal/objective Target Date: 06/2022 Responsible Party: therapist and patient Person delivering treatment: Licensed Psychologist [name],  PhD will support the patient's ability to achieve the goals identified. Resolved: No  Problem/Need: communication challenge - Cherysh has had some challenges with communicating with her family members.  Long-Term Goal #2: Improve communication between [place] and her family members.  Short-Term Objectives: Objective 2A: Nimrit will be able to identify the goals of her communication with her family members.  Objective 2B: Chaniya will be able to identify breakdowns in communication within 4 months. Objective 2C: Alyha will be able to implement more effective communication with her family members within 6 months. Interventions: Cognitive Behavioral Therapy, Assertiveness/Communication, and Motivational Interviewing , and other evidenced-based practices will be used to promote progress towards healthy functioning and to help manage decrease symptoms associated with their diagnosis.  Treatment Regimen: Individual  skill building sessions every 2-3 weeks to address treatment goal/objective Target Date: 06/2022 Responsible Party: therapist and patient Person delivering treatment: Licensed Psychologist [name], PhD will support the patient's ability to achieve the goals identified. Resolved: No  Problem/Need: Grief - Tomeca experienced the sudden loss of her father between the intake and initial therapy visit.  Long-Term Goal #3: Claudine will be able to process her grief.  Short-Term Objectives: Objective 3A: Saliha will be able to talk about her feelings related to grief.  Objective 3B: Sade will be able to identify and make use of supports to help her process her grief and manage her emotions.  Interventions: Cognitive Behavioral Therapy, Motivational Interviewing, and Grief Therapy , and other evidenced-based practices will be used to promote progress towards healthy functioning and to help manage decrease symptoms associated with their diagnosis.  Treatment Regimen:  Individual  skill building sessions every 2-3 weeks to address treatment goal/objective Target Date:  06/2022 Responsible Party: therapist and patient Person delivering treatment: Licensed Psychologist [name], PhD will support the patient's ability to achieve the goals identified. Resolved: No   [name], PhD

## 2022-07-03 ENCOUNTER — Ambulatory Visit: Payer: No Typology Code available for payment source | Admitting: Neurology

## 2022-07-03 ENCOUNTER — Telehealth: Payer: Self-pay | Admitting: Diagnostic Neuroimaging

## 2022-07-03 ENCOUNTER — Ambulatory Visit (INDEPENDENT_AMBULATORY_CARE_PROVIDER_SITE_OTHER): Payer: No Typology Code available for payment source | Admitting: Diagnostic Neuroimaging

## 2022-07-03 ENCOUNTER — Encounter: Payer: Self-pay | Admitting: Diagnostic Neuroimaging

## 2022-07-03 VITALS — BP 135/74 | HR 104 | Ht 65.0 in | Wt 163.0 lb

## 2022-07-03 DIAGNOSIS — R200 Anesthesia of skin: Secondary | ICD-10-CM

## 2022-07-03 NOTE — Progress Notes (Signed)
[facility]  PATIENT: Lauren Guerrero DOB: 05/01/1976  REFERRING CLINICIAN: Midge Minium, MD HISTORY FROM: patient  REASON FOR VISIT: new consult    HISTORICAL  CHIEF COMPLAINT:  Chief Complaint  Patient presents with   Numbness    RM 7 alone Pt is well, has been having bilateral numbness and tingling in hands and feet since about February. Also feels like she has muscle spasms in upper back. Also mention dizziness that started Saturday      HISTORY OF PRESENT ILLNESS:   46 year old female here for evaluation of intermittent numbness and tingling burning sensations.  09/07/2021 patient woke up around 4 in the morning and felt a burning fiery sensation in her upper and lower extremities.  She went to the emergency room for evaluation.  Symptoms resolved within 1 to 2 hours.  Lab testing and CT scan were unremarkable.  She was discharged home.  10/15/2021 patient's father passed away suddenly.  She developed increasing stress and anxiety, had to travel back to [place].  Has continued to have grief reaction, but is seeing a counselor, and trying to cope with exercise, friends and family support.  Nowadays continues to have some milder intermittent, migratory numbness tingling sensation which can affect right or left side, upper or lower extremities.  She tends to notice it mainly in hands and feet.  No symptoms in face, vision, speech, swallowing or torso.  Last week and she did have some fatigue and generalized "heavy" sensation throughout her body.  She is having some trouble sleeping at nighttime.  Averaging 5 to 6 hours of sleep at night.  Continues to have some anxiety sensations but is managing.    REVIEW OF SYSTEMS: Full 14 system review of systems performed and negative with exception of: as per HPI.  ALLERGIES: Allergies  Allergen Reactions   Peanut-Containing Drug Products Anaphylaxis and Hives   Penicillins Hives    Did it involve swelling  of the face/tongue/throat, SOB, or low BP? No Did it involve sudden or severe rash/hives, skin peeling, or any reaction on the inside of your mouth or nose? No Did you need to seek medical attention at a hospital or doctor's office? No When did it last happen?       If all above answers are "NO", may proceed with cephalosporin use.   Sulfa Antibiotics Hives    HOME MEDICATIONS: Outpatient Medications Prior to Visit  Medication Sig Dispense Refill   ferrous sulfate 324 MG TBEC Take 324 mg by mouth.     Multiple Vitamins-Minerals (MULTIVITAMIN ADULT PO) Take by mouth.     Vitamin D, Ergocalciferol, (DRISDOL) 1.25 MG (50000 UNIT) CAPS capsule Take 1 capsule by mouth every 7 days. 12 capsule 0   No facility-administered medications prior to visit.    PAST MEDICAL HISTORY: Past Medical History:  Diagnosis Date   Allergy    Anemia    Fibroid    Medical history non-contributory    Thyroid disease     PAST SURGICAL HISTORY: Past Surgical History:  Procedure Laterality Date   NO PAST SURGERIES     thyroid nodules     WISDOM TOOTH EXTRACTION      FAMILY HISTORY: Family History  Problem Relation Age of Onset   Thyroid disease Mother    Hyperlipidemia Father    Hypertension Father    Heart disease Father    Colon cancer Father    Coronary artery disease Father    Stomach cancer Neg Hx  Esophageal cancer Neg Hx    Rectal cancer Neg Hx     SOCIAL HISTORY: Social History   Socioeconomic History   Marital status: Married    Spouse name: Not on file   Number of children: 2   Years of education: Not on file   Highest education level: Not on file  Occupational History   Occupation: self employment  Tobacco Use   Smoking status: Never   Smokeless tobacco: Never  Vaping Use   Vaping Use: Never used  Substance and Sexual Activity   Alcohol use: Yes    Comment: socially   Drug use: No   Sexual activity: Yes  Other Topics Concern   Not on file  Social History  Narrative   Not on file   Social Determinants of Health   Financial Resource Strain: Not on file  Food Insecurity: No Food Insecurity (03/04/2022)   Hunger Vital Sign    Worried About Running Out of Food in the Last Year: Never true    Ran Out of Food in the Last Year: Never true  Transportation Needs: Not on file  Physical Activity: Not on file  Stress: Not on file  Social Connections: Not on file  Intimate Partner Violence: Not on file     PHYSICAL EXAM  GENERAL EXAM/CONSTITUTIONAL: Vitals:  Vitals:   07/03/22 1113  BP: 135/74  Pulse: (!) 104  Weight: 163 lb (73.9 kg)  Height: '5\' 5"'$  (1.651 m)   Body mass index is 27.12 kg/m. Wt Readings from Last 3 Encounters:  07/03/22 163 lb (73.9 kg)  06/16/22 160 lb 2 oz (72.6 kg)  06/10/22 162 lb (73.5 kg)   Patient is in no distress; well developed, nourished and groomed; neck is supple  CARDIOVASCULAR: Examination of carotid arteries is normal; no carotid bruits Regular rate and rhythm, no murmurs Examination of peripheral vascular system by observation and palpation is normal  EYES: Ophthalmoscopic exam of optic discs and posterior segments is normal; no papilledema or hemorrhages No results found.  MUSCULOSKELETAL: Gait, strength, tone, movements noted in Neurologic exam below  NEUROLOGIC: MENTAL STATUS:      No data to display         awake, alert, oriented to person, place and time recent and remote memory intact normal attention and concentration language fluent, comprehension intact, naming intact fund of knowledge appropriate  CRANIAL NERVE:  2nd - no papilledema on fundoscopic exam 2nd, 3rd, 4th, 6th - pupils equal and reactive to light, visual fields full to confrontation, extraocular muscles intact, no nystagmus 5th - facial sensation symmetric 7th - facial strength symmetric 8th - hearing intact 9th - palate elevates symmetrically, uvula midline 11th - shoulder shrug symmetric 12th - tongue  protrusion midline  MOTOR:  normal bulk and tone, full strength in the BUE, BLE  SENSORY:  normal and symmetric to light touch, pinprick, temperature, vibration  COORDINATION:  finger-nose-finger, fine finger movements normal  REFLEXES:  deep tendon reflexes 1+ and symmetric  GAIT/STATION:  narrow based gait     DIAGNOSTIC DATA (LABS, IMAGING, TESTING) - I reviewed patient records, labs, notes, testing and imaging myself where available.  Lab Results  Component Value Date   WBC 4.5 06/10/2022   HGB 12.7 06/10/2022   HCT 38.9 06/10/2022   MCV 86.2 06/10/2022   PLT 248.0 06/10/2022      Component Value Date/Time   NA 136 06/10/2022 1156   K 3.8 06/10/2022 1156   CL 103 06/10/2022 1156  CO2 29 06/10/2022 1156   GLUCOSE 114 (H) 06/10/2022 1156   BUN 13 06/10/2022 1156   CREATININE 0.78 06/10/2022 1156   CALCIUM 9.1 06/10/2022 1156   PROT 7.3 06/10/2022 1156   ALBUMIN 4.4 06/10/2022 1156   AST 22 06/10/2022 1156   ALT 17 06/10/2022 1156   ALKPHOS 44 06/10/2022 1156   BILITOT 0.4 06/10/2022 1156   GFRNONAA >60 10/15/2021 1803   Lab Results  Component Value Date   CHOL 159 06/10/2022   HDL 66.70 06/10/2022   LDLCALC 80 06/10/2022   TRIG 59.0 06/10/2022   CHOLHDL 2 06/10/2022   Lab Results  Component Value Date   HGBA1C 5.8 06/10/2022   Lab Results  Component Value Date   VITAMINB12 386 06/10/2022   Lab Results  Component Value Date   TSH 1.27 06/10/2022    09/07/21 CT head [I reviewed images myself and agree with interpretation. -VRP]  - normal    ASSESSMENT AND PLAN  46 y.o. year old female here with:   Dx:  1. Numbness     PLAN:  ABNORMAL SENSATIONS (since Feb 2023; intermittent, migratory, burning, tingling, sensations; neuro exam normal; unlikely major peripheral neuropathy; could be benign paresthesias; will check MRI to rule out CNS etiologies) - check MRI brain (rule out autoimmune, inflamm, demyelinating) - could consider  duloxetine or amitriptyline for anxiety, insomnia, abnl sensations - continue to optimize nutrition, exercise, sleep, stress mgmt  Orders Placed This Encounter  Procedures   MR BRAIN W WO CONTRAST   Return for pending if symptoms worsen or fail to improve, pending test results.    Penni Bombard, MD 61/12/735, 10:62 AM Certified in Neurology, Neurophysiology and Neuroimaging  [facility] [address], [place], [place] [address] [phone]

## 2022-07-03 NOTE — Telephone Encounter (Signed)
MR Brain w/wo contrast Dr. Toney Rakes [facility] Josem Kaufmann: [id number] (exp. 07/07/22 to 08/06/22)    Just an FYI patient is scheduled Tuesday 07/08/22 at [facility] mobile unit.

## 2022-07-03 NOTE — Patient Instructions (Signed)
-   check MRI brain  - could consider duloxetine or amitriptyline for anxiety, insomnia, abnl sensations  - continue to optimize nutrition, exercise, sleep, stress mgmt

## 2022-07-08 ENCOUNTER — Ambulatory Visit (INDEPENDENT_AMBULATORY_CARE_PROVIDER_SITE_OTHER): Payer: No Typology Code available for payment source

## 2022-07-08 DIAGNOSIS — R200 Anesthesia of skin: Secondary | ICD-10-CM | POA: Diagnosis not present

## 2022-07-08 MED ORDER — GADOBENATE DIMEGLUMINE 529 MG/ML IV SOLN
15.0000 mL | Freq: Once | INTRAVENOUS | Status: AC | PRN
  Administered 2022-07-08: 15 mL via INTRAVENOUS

## 2022-07-10 ENCOUNTER — Other Ambulatory Visit (HOSPITAL_COMMUNITY): Payer: Self-pay

## 2022-07-24 ENCOUNTER — Ambulatory Visit: Payer: No Typology Code available for payment source | Admitting: Clinical

## 2022-08-12 ENCOUNTER — Ambulatory Visit (INDEPENDENT_AMBULATORY_CARE_PROVIDER_SITE_OTHER): Payer: 59 | Admitting: Clinical

## 2022-08-12 DIAGNOSIS — F419 Anxiety disorder, unspecified: Secondary | ICD-10-CM

## 2022-08-12 NOTE — Progress Notes (Signed)
[facility] Counselor/Therapist Progress Note  Patient ID: Lauren Guerrero, MRN: [id number]    Date: 08/12/22  Time Spent: 10:03 am - 11:03 am: 60 Minutes  Type of Service Provided Individual Therapy  Type of Contact virtual (via [place] with real time audio and visual interaction)  Patient Location: home       Provider Location: office  Lauren Guerrero participated from home, via video, and consented to treatment. Therapist participated from office.    Mental Status Exam: Appearance:  Neat and Well Groomed     Behavior: Appropriate and Sharing  Motor: Normal  Speech/Language:  Clear and Coherent and Normal Rate  Affect: Appropriate  Mood: normal  Thought process: normal  Thought content:   WNL  Sensory/Perceptual disturbances:   WNL  Orientation: oriented to person, place, time/date, and situation  Attention: Good  Concentration: Good  Memory: WNL  Fund of knowledge:  Good  Insight:   Fair to good  Judgment:  Good  Impulse Control: Good   Risk Assessment: No apparent indicators of SI or HI during the visit   Presenting Problems, Reported Symptoms, and /or Interim History: Lauren Guerrero presented for a session to address communication challenges.   Subjective: Lauren Guerrero  presented for an individual outpatient therapy session. The following was addressed during sessions.   Lauren Guerrero rated her mood as an 8 out of 10 (with 1 being sad, 5 being neutral, and 10 being happy). Lauren Guerrero rated her anxiety as a 4 out of 10 and anger/frustration as a 2 out of 10 (with 10 being high).  She had several productive conversations with her mother during her visit, including finding out more about her mother's family of origin. Lauren Guerrero was able to communicate about her hurt related to her mother's reduced emotional availability during her childhood and her mother was somewhat receptive to this feedback.  This framework was also applied to conversations that Lauren Guerrero wants to have  with her husband.  These types of conversations were discussed with therapist suggested that they may want to begin by having some enjoyable times together with her husband to expand the groundwork of positivity between them prior to having some of these heavier discussions.  Strategies for communication were discussed.     Interventions/Psychotherapy Techniques Used During Session: Assertiveness/Communication and Insight-Oriented  Diagnosis: Anxiety  MENTAL HEALTH INTERVENTIONS USED DURING TREATMENT & PATIENT'S RESPONSE TO INTERVENTIONS:  Short-term Objective addressed today: Lauren Guerrero will be able to implement more effective communication with her family members within 6 months. Mental health techniques used: Objective was addressed in session through the use of  Assertiveness/Communication and Insight-Oriented and discussion. Lauren Guerrero's response was positive.  Progress Toward Goal: Lauren Guerrero has made good strides with her mother but continues to work on communication with her spouse.    PLAN  1. Lauren Guerrero  will return for a therapy session.   2. Homework Given: Take the next steps in her relationship with her mother, try to schedule some outings with her husband to expand positivity and the habit of spending time together as a couple. This homework will be reviewed with Lauren Guerrero at the next visit.  3. During the next session check in on mood, anxiety, communication.     Lauren Chess, PhD  Individual Treatment Plan - please see the note from 10/22/2021 for complete treatment plan information  Problem/Need: Anxiety - Lauren Guerrero experiences anxiety which is exacerbated by the hectic schedule and her expectations of herself.   Long-Term Goal #1: Reduce overall level, frequency,  and intensity of the feelings of anxiety evidenced by decreased anxiety symptoms and stress level per client report  Short-Term Objectives: Objective 1A: Lauren Guerrero will be able to measure her anxiety and stress  level within 1 month. - GOAL MET Objective 1B: Lauren Guerrero will be able to describe the link between thoughts-feelings-and actions and identify the major components of her anxiety/stress within 3 months. - GOAL MET Objective 1C: Lauren Guerrero will be able to identify negative thoughts that are contributing to or perpetuating anxiety/stress and identify at least one alternative thought within 6 months. (Verbalize an understanding of the role that distorted thinking plays in creating fears, excessive worry, and ruminations). - [name] partially met Objective 1D: Develop a range of coping mechanisms to address negative thinking patterns and physiological symptoms of anxiety. - Goal partially met Interventions: Cognitive Behavioral Therapy, Motivational Interviewing, and Psycho-education/Bibliotherapy  coping skills, and other evidenced-based practices will be used to promote progress towards healthy functioning and to help manage decrease symptoms associated with their diagnosis.  Treatment Regimen: Individual  skill building sessions every 2-3 weeks for addressing treatment goal/objective Target Date: target date changed to 10/2022 Responsible Party: therapist and patient Person delivering treatment: Licensed Psychologist Lauren Chess, PhD will support the patient's ability to achieve the goals identified. Resolved: Partially - Jyasia has met objectives A and B, and has made substantial progress with C and D, but goal will continue to be addressed in session until can be moved to a full maintenance phase.  Problem/Need: communication challenge - Lauren Guerrero has had some challenges with communicating with her family members.  Long-Term Goal #2: Improve communication between Lauren Guerrero and her family members.  Short-Term Objectives: Objective 2A: Lauren Guerrero will be able to identify the goals of her communication with her family members.  Objective 2B: Lauren Guerrero will be able to identify breakdowns in communication within 4  months. Objective 2C: Lauren Guerrero will be able to implement more effective communication with her family members within 6 months. Interventions: Cognitive Behavioral Therapy, Assertiveness/Communication, and Motivational Interviewing , and other evidenced-based practices will be used to promote progress towards healthy functioning and to help manage decrease symptoms associated with their diagnosis.  Treatment Regimen: Individual  skill building sessions every 2-3 weeks to address treatment goal/objective Target Date: target date changed to 10/2022 Responsible Party: therapist and patient Person delivering treatment: Licensed Psychologist Lauren Chess, PhD will support the patient's ability to achieve the goals identified. Resolved: No - Marylynne has made strides with communication with some of her family members but there continue to be things to work on in her communication with others  Problem/Need: [place] experienced the sudden loss of her father between the intake and initial therapy visit.  Long-Term Goal #3: Montie will be able to process her grief.  Short-Term Objectives: Objective 3A: [place] will be able to talk about her feelings related to grief. _ GOAL MET Objective 3B: Sarahgrace will be able to identify and make use of supports to help her process her grief and manage her emotions. - GOAL MET Interventions: Cognitive Behavioral Therapy, Motivational Interviewing, and Grief Therapy , and other evidenced-based practices will be used to promote progress towards healthy functioning and to help manage decrease symptoms associated with their diagnosis.  Treatment Regimen: Individual  skill building sessions every 2-3 weeks to address treatment goal/objective Target Date: 06/2022  Responsible Party: therapist and patient Person delivering treatment: Licensed Psychologist Lauren Chess, PhD will support the patient's ability to achieve the goals identified. Resolved: Short-term  objectives have been  met, but as grief is not a linear process grief and exploration of grief may continue to be a part of sessions in the future.  Therefore, the long-term goal will remain though there are no active short-term goals associated with this at this time.    Lauren Chess, PhD

## 2022-08-13 ENCOUNTER — Ambulatory Visit: Payer: No Typology Code available for payment source | Admitting: Diagnostic Neuroimaging

## 2022-09-02 ENCOUNTER — Ambulatory Visit: Payer: 59 | Admitting: Clinical

## 2022-09-09 ENCOUNTER — Ambulatory Visit: Payer: 59 | Admitting: Clinical

## 2022-09-17 ENCOUNTER — Other Ambulatory Visit: Payer: Self-pay | Admitting: Family Medicine

## 2022-09-17 DIAGNOSIS — Z1231 Encounter for screening mammogram for malignant neoplasm of breast: Secondary | ICD-10-CM

## 2022-09-18 ENCOUNTER — Ambulatory Visit: Payer: 59 | Admitting: Podiatry

## 2022-09-18 DIAGNOSIS — S90851A Superficial foreign body, right foot, initial encounter: Secondary | ICD-10-CM

## 2022-09-18 DIAGNOSIS — S90859A Superficial foreign body, unspecified foot, initial encounter: Secondary | ICD-10-CM

## 2022-09-18 NOTE — Progress Notes (Signed)
  Subjective:  Patient ID: Lauren Guerrero, female    DOB: 12-26-1975,  MRN: [id number]  Chief Complaint  Patient presents with   Foot Pain    Pt stated she has a splinter     47 y.o. female presents with the above complaint.  Patient presents with right plantar foot splinter.  She states that she was walking and one of the foot border was lifted up and caught a splinter.  She wanted to have it removed.  She could feel it but she was not able to get it out.  She denies any other acute complaints.  Would like to discuss treatment options to remove.  It is not too deep   Review of Systems: Negative except as noted in the HPI. Denies N/V/F/Ch.  Past Medical History:  Diagnosis Date   Allergy    Anemia    Fibroid    Medical history non-contributory    Thyroid disease     Current Outpatient Medications:    ferrous sulfate 324 MG TBEC, Take 324 mg by mouth., Disp: , Rfl:    Multiple Vitamins-Minerals (MULTIVITAMIN ADULT PO), Take by mouth., Disp: , Rfl:    Vitamin D, Ergocalciferol, (DRISDOL) 1.25 MG (50000 UNIT) CAPS capsule, Take 1 capsule by mouth every 7 days., Disp: 12 capsule, Rfl: 0  Social History   Tobacco Use  Smoking Status Never  Smokeless Tobacco Never    Allergies  Allergen Reactions   Peanut-Containing Drug Products Anaphylaxis and Hives   Penicillins Hives    Did it involve swelling of the face/tongue/throat, SOB, or low BP? No Did it involve sudden or severe rash/hives, skin peeling, or any reaction on the inside of your mouth or nose? No Did you need to seek medical attention at a hospital or doctor's office? No When did it last happen?       If all above answers are "NO", may proceed with cephalosporin use.   Sulfa Antibiotics Hives   Objective:  There were no vitals filed for this visit. There is no height or weight on file to calculate BMI. Constitutional Well developed. Well nourished.  Vascular Dorsalis pedis pulses palpable bilaterally. Posterior  tibial pulses palpable bilaterally. Capillary refill normal to all digits.  No cyanosis or clubbing noted. Pedal hair growth normal.  Neurologic Normal speech. Oriented to person, place, and time. Epicritic sensation to light touch grossly present bilaterally.  Dermatologic Entry point noted to the wood splinter on the plantar aspect of the foot.  Pain on palpation to the splinter.  Does not appear to be penetrating past the dermal layer.  Orthopedic: Normal joint ROM without pain or crepitus bilaterally. No visible deformities. No bony tenderness.   Radiographs: None Assessment:   1. Wood splinter in plantar aspect of foot    Plan:  Patient was evaluated and treated and all questions answered.  Right superficial wood splinter -Using chisel blade to handle and pick up the wood splinter was removed in standard technique without any complication.  I discussed prevention technique if any foot and ankle issues or in the future she will come back and see me.  She had immediate relief after removal of splinter  No follow-ups on file.

## 2022-09-26 ENCOUNTER — Ambulatory Visit (HOSPITAL_BASED_OUTPATIENT_CLINIC_OR_DEPARTMENT_OTHER)
Admission: RE | Admit: 2022-09-26 | Discharge: 2022-09-26 | Disposition: A | Discharge status: Home / Self Care | Payer: 59 | Source: Ambulatory Visit | Attending: Otolaryngology | Admitting: Otolaryngology

## 2022-09-26 DIAGNOSIS — E041 Nontoxic single thyroid nodule: Secondary | ICD-10-CM | POA: Insufficient documentation

## 2022-09-26 DIAGNOSIS — E042 Nontoxic multinodular goiter: Secondary | ICD-10-CM | POA: Diagnosis not present

## 2022-09-30 ENCOUNTER — Ambulatory Visit (INDEPENDENT_AMBULATORY_CARE_PROVIDER_SITE_OTHER): Payer: 59 | Admitting: Clinical

## 2022-09-30 DIAGNOSIS — F419 Anxiety disorder, unspecified: Secondary | ICD-10-CM

## 2022-09-30 NOTE — Progress Notes (Signed)
[facility] Counselor/Therapist Progress Note  Patient ID: STAR PRIMER, MRN: [id number]    Date: 09/30/22  Time Spent: 10:05 am - 11:05 am: 60 Minutes  Type of Service Provided Individual Therapy  Type of Contact virtual (via [place] with real time audio and visual interaction)  Patient Location: home       Provider Location: office  Colletta [place] Basquez participated from home, via video, and consented to treatment. Therapist participated from office.    Mental Status Exam: Appearance:  Casual and Well Groomed     Behavior: Sharing and Motivated  Motor: Normal  Speech/Language:  Clear and Coherent and Normal Rate  Affect: Appropriate  Mood: normal and positive  Thought process: normal  Thought content:   WNL  Sensory/Perceptual disturbances:   WNL  Orientation: oriented to person, place, time/date, and situation  Attention: Good  Concentration: Good  Memory: WNL  Fund of knowledge:  Good  Insight:   Good  Judgment:  Good  Impulse Control: Good   Risk Assessment: No apparent indicators of SI or HI during the visit  Presenting Problems, Reported Symptoms, and /or Interim History: Krysti presented for a session to address life stress.   Subjective: [name] presented for an individual outpatient therapy session. The following was addressed during sessions.   Rayette rated her mood as a 8 out of 10 (with 1 being sad, 5 being neutral, and 10 being happy).   Lamyiah rated her anxiety as a 1-2 out of 10 and frustration/anger 7 out of 10  (with 10 being high).  She reported some positive progress in addressing social anxiety and expanding her social opportunities and in communication with her husband. Some ongoing communication challenges with her family of origin were discussed. Some stressors associated with her son were discussed along with some possible ways to approach the situation differently and communicate with him differently (e.g., offering choices, only  telling him about consequences that she is intending to follow through with, taking time during interactions to consider her response, etc.).   Interventions/Psychotherapy Techniques Used During Session: Cognitive Behavioral Therapy, Assertiveness/Communication, Solution-Oriented/Positive Psychology, and Psycho-education/Bibliotherapy  Diagnosis: Anxiety  MENTAL HEALTH INTERVENTIONS USED DURING TREATMENT & PATIENT'S RESPONSE TO INTERVENTIONS:  Short-term Objective addressed today:Alie will be able to implement more effective communication with her family members within 6 months. Mental health techniques used: Objective was addressed in session through the use of  Cognitive Behavioral Therapy, Assertiveness/Communication, Solution-Oriented/Positive Psychology, and Psycho-education/Bibliotherapy and discussion. Caylyn's response was positive.  Progress Toward Goal: progressing     PLAN  1. Roxxanne will return for a therapy session.   2. Homework Given:  implement new strategies with her son. This homework will be reviewed with [name] at the next visit.  3. During the next session check in on life stress, communication, and anxiety.     [name], PhD  Individual Treatment Plan - please see the note from 10/22/2021 for complete treatment plan information  Problem/Need: Anxiety - Nitisha experiences anxiety which is exacerbated by the hectic schedule and her expectations of herself.   Long-Term Goal #1: Reduce overall level, frequency, and intensity of the feelings of anxiety evidenced by decreased anxiety symptoms and stress level per client report  Short-Term Objectives: Objective 1A: Hawo will be able to measure her anxiety and stress level within 1 month. - GOAL MET Objective 1B: [name] will be able to describe the link between thoughts-feelings-and actions and identify the major components of her anxiety/stress within 3 months. - GOAL  MET Objective 1C: Balinda will be  able to identify negative thoughts that are contributing to or perpetuating anxiety/stress and identify at least one alternative thought within 6 months. (Verbalize an understanding of the role that distorted thinking plays in creating fears, excessive worry, and ruminations). - [name] partially met Objective 1D: Develop a range of coping mechanisms to address negative thinking patterns and physiological symptoms of anxiety. - Goal partially met Interventions: Cognitive Behavioral Therapy, Motivational Interviewing, and Psycho-education/Bibliotherapy  coping skills, and other evidenced-based practices will be used to promote progress towards healthy functioning and to help manage decrease symptoms associated with their diagnosis.  Treatment Regimen: Individual  skill building sessions every 2-3 weeks for addressing treatment goal/objective Target Date: target date changed to 10/2022 Responsible Party: therapist and patient Person delivering treatment: Licensed Psychologist [name], PhD will support the patient's ability to achieve the goals identified. Resolved: Partially - Elanore has met objectives A and B, and has made substantial progress with C and D, but goal will continue to be addressed in session until can be moved to a full maintenance phase.  Problem/Need: communication challenge - Whitnie has had some challenges with communicating with her family members.  Long-Term Goal #2: Improve communication between Lyons and her family members.  Short-Term Objectives: Objective 2A: Mahaley will be able to identify the goals of her communication with her family members.  Objective 2B: Detrick will be able to identify breakdowns in communication within 4 months. Objective 2C: Lorne will be able to implement more effective communication with her family members within 6 months. Interventions: Cognitive Behavioral Therapy, Assertiveness/Communication, and Motivational Interviewing , and other  evidenced-based practices will be used to promote progress towards healthy functioning and to help manage decrease symptoms associated with their diagnosis.  Treatment Regimen: Individual  skill building sessions every 2-3 weeks to address treatment goal/objective Target Date: target date changed to 10/2022 Responsible Party: therapist and patient Person delivering treatment: Licensed Psychologist [name], PhD will support the patient's ability to achieve the goals identified. Resolved: No - Raahi has made strides with communication with some of her family members but there continue to be things to work on in her communication with others  Problem/Need: [place] experienced the sudden loss of her father between the intake and initial therapy visit.  Long-Term Goal #3: Brei will be able to process her grief.  Short-Term Objectives: Objective 3A: Kinzley will be able to talk about her feelings related to grief. _ GOAL MET Objective 3B: Rimsha will be able to identify and make use of supports to help her process her grief and manage her emotions. - GOAL MET Interventions: Cognitive Behavioral Therapy, Motivational Interviewing, and Grief Therapy , and other evidenced-based practices will be used to promote progress towards healthy functioning and to help manage decrease symptoms associated with their diagnosis.  Treatment Regimen: Individual  skill building sessions every 2-3 weeks to address treatment goal/objective Target Date: 06/2022 - initial goals met Responsible Party: therapist and patient Person delivering treatment: Licensed Psychologist [name], PhD will support the patient's ability to achieve the goals identified. Resolved: Short-term objectives have been met, but as grief is not a linear process grief and exploration of grief may continue to be a part of sessions in the future.  Therefore, the long-term goal will remain though there are no active short-term  goals associated with this at this time.  [name], PhD

## 2022-10-14 ENCOUNTER — Ambulatory Visit (INDEPENDENT_AMBULATORY_CARE_PROVIDER_SITE_OTHER): Payer: 59 | Admitting: Clinical

## 2022-10-14 DIAGNOSIS — F419 Anxiety disorder, unspecified: Secondary | ICD-10-CM | POA: Diagnosis not present

## 2022-10-14 NOTE — Progress Notes (Signed)
[facility] Counselor/Therapist Progress Note  Patient ID: Lauren Guerrero, MRN: [id number]    Date: 10/14/22  Time Spent: 10:04 am - 11:00 am: 56 Minutes  Type of Service Provided Individual Therapy  Type of Contact virtual (via [place] with real time audio and visual interaction)  Patient Location: home       Provider Location: office  Lauren Guerrero participated from home, via video, and consented to treatment. Therapist participated from office.    Mental Status Exam: Appearance:  Neat and Well Groomed     Behavior: Appropriate  Motor: Normal  Speech/Language:  Clear and Coherent and Normal Rate  Affect: Appropriate sad at times  Mood: normaland a bit sad  Thought process: normal  Thought content:   WNL  Sensory/Perceptual disturbances:   WNL  Orientation: oriented to person, place, time/date, and situation  Attention: Good  Concentration: Good  Memory: WNL  Fund of knowledge:  Good  Insight:   Good  Judgment:  Good  Impulse Control: Good   Risk Assessment: No apparent indicators of SI or HI during the visit   Presenting Problems, Reported Symptoms, and /or Interim History: Lauren Guerrero presented for a session to address grief and life stress.   Subjective: Lauren Guerrero presented for an individual outpatient therapy session. The following was addressed during sessions.   Lauren Guerrero reported experiencing decreased mood as she approaches the anniversary of her father passing away.  Strategies that she can use to help accept and manage her grief were discussed. Lauren Guerrero rated her mood as a 3-4 out of 10 (with 1 being sad, 5 being neutral, and 10 being happy).  Lauren Guerrero rated her anxiety as a 5 out of 10 and frustration/irritability as a 5 out of 10 (with 10 being high).  In addition to working on her grief,  Lauren Guerrero reported some stressors associated with her children.  Strategies to help manage this, including helping her daughter who is experiencing some upset as  well, were discussed.   Interventions/Psychotherapy Techniques Used During Session: Cognitive Behavioral Therapy, Assertiveness/Communication, and Grief Therapy  Diagnosis: Anxiety  MENTAL HEALTH INTERVENTIONS USED DURING TREATMENT & PATIENT'S RESPONSE TO INTERVENTIONS:  Long-Term Objective addressed today: Lauren Guerrero will be able to process her grief.  Mental health techniques used: Objective was addressed in session through the use of Grief Therapy and discussion. Dymond's response was positive.  Progress Toward Goal: progressing  Short-term Objective addressed today:Lauren Guerrero will be able to implement more effective communication with her family members. Mental health techniques used: Objective was addressed in session through the use of Cognitive Behavioral Therapy and Assertiveness/Communication and discussion. Lauren Guerrero's response was positive.  Progress Toward Goal: progressing    PLAN  1. Lauren Guerrero will return for a therapy session.   2. Homework Given: Try to implement some of the strategies to help support her children with getting ready for activities and scheduling, provide herself the space and time to process her grief in the coming days given the approaching anniversary of her father's death. This homework will be reviewed with Lauren Guerrero at the next visit.  3. During the next session check in on mood, anxiety, life stress.     Lauren Guerrero, Lauren Guerrero  Individual Treatment Plan - please see the note from 10/22/2021 for complete treatment plan information  Problem/Need: Anxiety - Lauren Guerrero experiences anxiety which is exacerbated by the hectic schedule and her expectations of herself.   Long-Term Goal #1: Reduce overall level, frequency, and intensity of the feelings of anxiety evidenced by decreased anxiety  symptoms and stress level per client report  Short-Term Objectives: Objective 1A: Lauren Guerrero will be able to measure her anxiety and stress level within 1 month. - GOAL  MET Objective 1B: Lauren Guerrero will be able to describe the link between thoughts-feelings-and actions and identify the major components of her anxiety/stress within 3 months. - GOAL MET Objective 1C: Lauren Guerrero will be able to identify negative thoughts that are contributing to or perpetuating anxiety/stress and identify at least one alternative thought within 6 months. (Verbalize an understanding of the role that distorted thinking plays in creating fears, excessive worry, and ruminations). - [name] partially met Objective 1D: Develop a range of coping mechanisms to address negative thinking patterns and physiological symptoms of anxiety. - Goal partially met Interventions: Cognitive Behavioral Therapy, Motivational Interviewing, and Psycho-education/Bibliotherapy  coping skills, and other evidenced-based practices will be used to promote progress towards healthy functioning and to help manage decrease symptoms associated with their diagnosis.  Treatment Regimen: Individual  skill building sessions every 2-3 weeks for addressing treatment goal/objective Target Date: target date changed to 10/2022 Responsible Party: therapist and patient Person delivering treatment: Licensed Psychologist Lauren Guerrero, Lauren Guerrero will support the patient's ability to achieve the goals identified. Resolved: Partially - Lauren Guerrero has met objectives A and B, and has made substantial progress with C and D, but goal will continue to be addressed in session until can be moved to a full maintenance phase.  Problem/Need: communication challenge - Lauren Guerrero has had some challenges with communicating with her family members.  Long-Term Goal #2: Improve communication between Lauren Guerrero and her family members.  Short-Term Objectives: Objective 2A: Lauren Guerrero will be able to identify the goals of her communication with her family members.  Objective 2B: Lauren Guerrero will be able to identify breakdowns in communication within 4 months. Objective 2C:  Lauren Guerrero will be able to implement more effective communication with her family members . Interventions: Cognitive Behavioral Therapy, Assertiveness/Communication, and Motivational Interviewing , and other evidenced-based practices will be used to promote progress towards healthy functioning and to help manage decrease symptoms associated with their diagnosis.  Treatment Regimen: Individual  skill building sessions every 2-3 weeks to address treatment goal/objective Target Date: target date changed to 10/2022 Responsible Party: therapist and patient Person delivering treatment: Licensed Psychologist Lauren Guerrero, Lauren Guerrero will support the patient's ability to achieve the goals identified. Resolved: No - Alzada has made strides with communication with some of her family members but there continue to be things to work on in her communication with others  Problem/Need: [place] experienced the sudden loss of her father between the intake and initial therapy visit.  Long-Term Goal #3: Lauren Guerrero will be able to process her grief.  Short-Term Objectives: Objective 3A: Lauren Guerrero will be able to talk about her feelings related to grief. _ GOAL MET Objective 3B: Lauren Guerrero will be able to identify and make use of supports to help her process her grief and manage her emotions. - GOAL MET Interventions: Cognitive Behavioral Therapy, Motivational Interviewing, and Grief Therapy , and other evidenced-based practices will be used to promote progress towards healthy functioning and to help manage decrease symptoms associated with their diagnosis.  Treatment Regimen: Individual  skill building sessions every 2-3 weeks to address treatment goal/objective Target Date: 06/2022 - initial goals met Responsible Party: therapist and patient Person delivering treatment: Licensed Psychologist Lauren Guerrero, Lauren Guerrero will support the patient's ability to achieve the goals identified. Resolved: Short-term objectives have been  met, but as grief is not a linear process grief  and exploration of grief may continue to be a part of sessions in the future.  Therefore, the long-term goal will remain though there are no active short-term goals associated with this at this time.    Lauren Guerrero, Lauren Guerrero

## 2022-10-28 ENCOUNTER — Ambulatory Visit: Payer: 59 | Admitting: Clinical

## 2022-11-04 ENCOUNTER — Ambulatory Visit
Admission: RE | Admit: 2022-11-04 | Discharge: 2022-11-04 | Disposition: A | Discharge status: Home / Self Care | Payer: 59 | Source: Ambulatory Visit | Attending: Family Medicine | Admitting: Family Medicine

## 2022-11-04 ENCOUNTER — Inpatient Hospital Stay: Admission: RE | Admit: 2022-11-04 | Payer: 59 | Source: Ambulatory Visit

## 2022-11-04 DIAGNOSIS — Z1231 Encounter for screening mammogram for malignant neoplasm of breast: Secondary | ICD-10-CM

## 2022-11-21 IMAGING — CT CT HEAD W/O CM
4 series · 17 of 47 positions shown, 19 images · non-contrast
Comparison: None.

CLINICAL DATA: Tingling and burning in the bilateral arms and legs
beginning this morning.



[Series 2: head wo · axial · 0.44mm/px · z∈[-125,-5]mm · 7 of 34 slices shown, 9 images]
[im 5/34  brain]
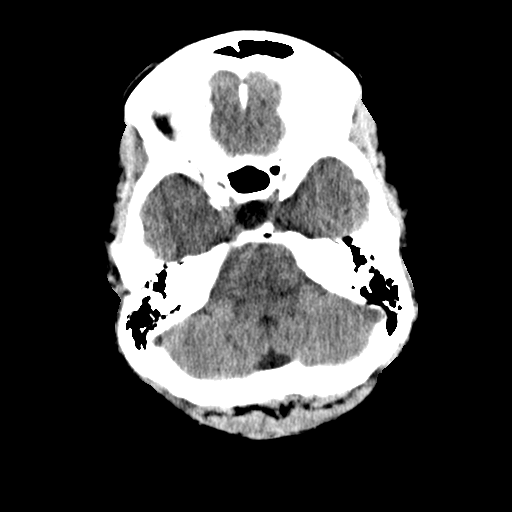
[im 5/34  bone]
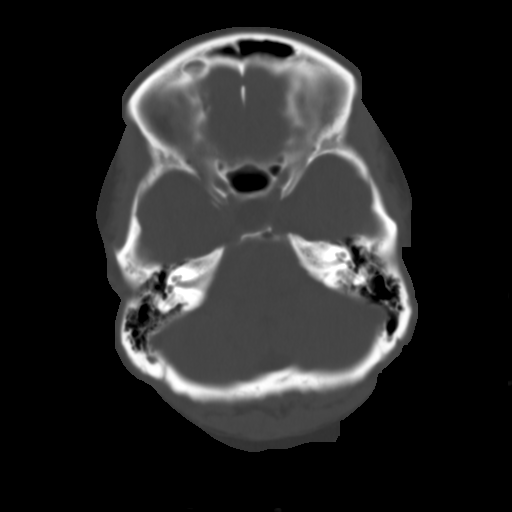
[im 9/34  brain]
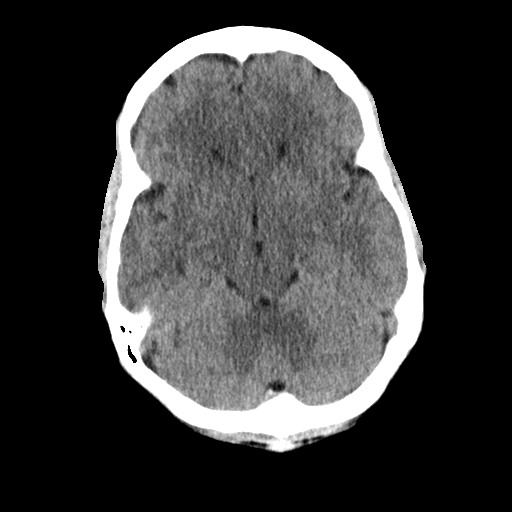
[im 13/34  brain]
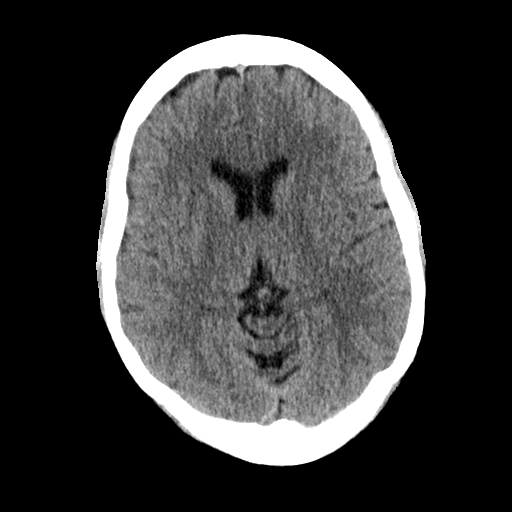
[im 17/34  brain]
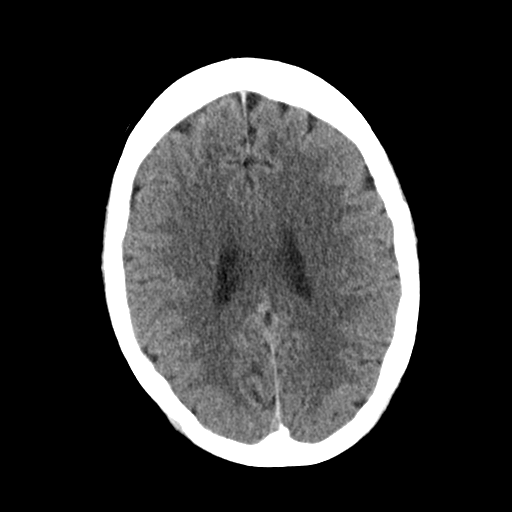
[im 21/34  brain]
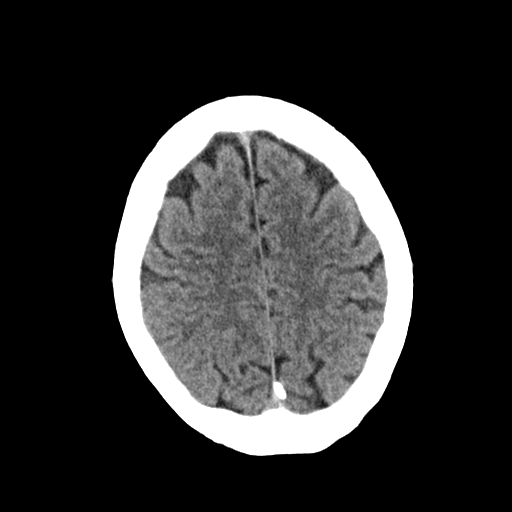
[im 21/34  bone]
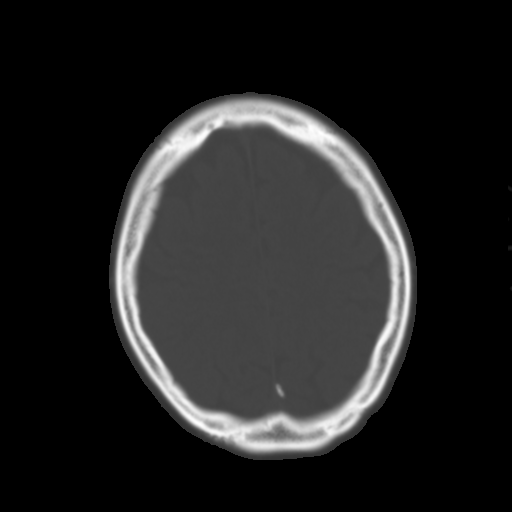
[im 25/34  brain]
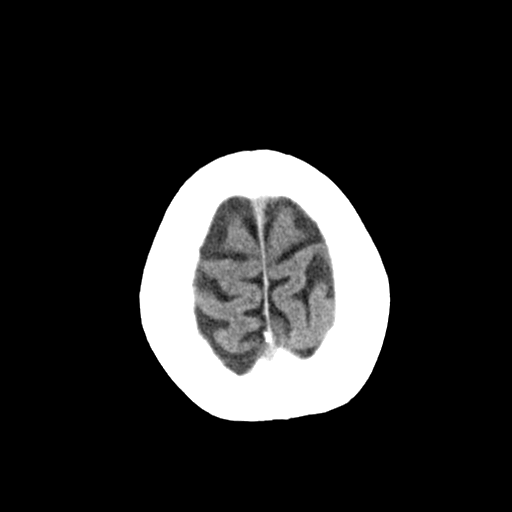
[im 29/34  brain]
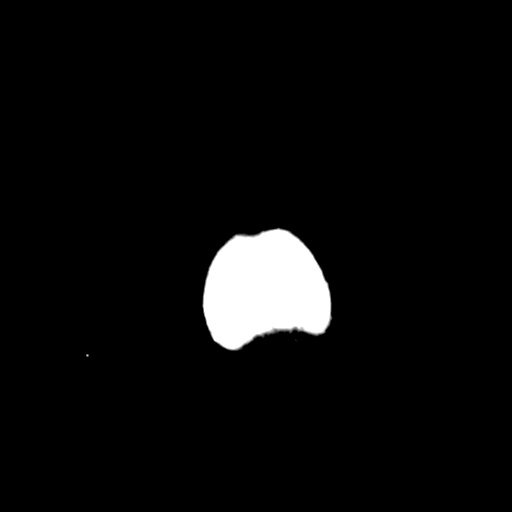

[Series 3: head bone · axial · 0.44mm/px · z∈[-129,-71]mm · 4 of 84 slices shown]
[im 9/84  bone]
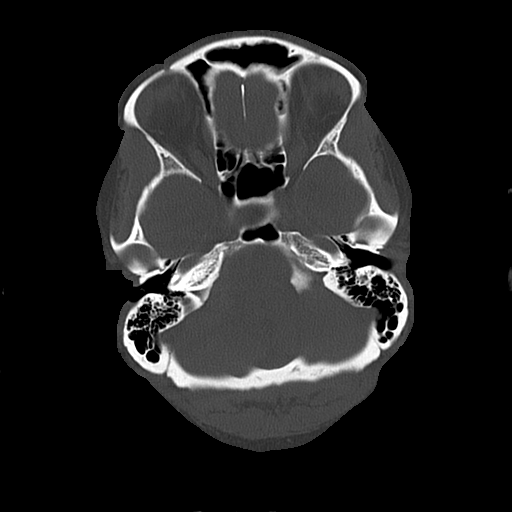
[im 17/84  bone]
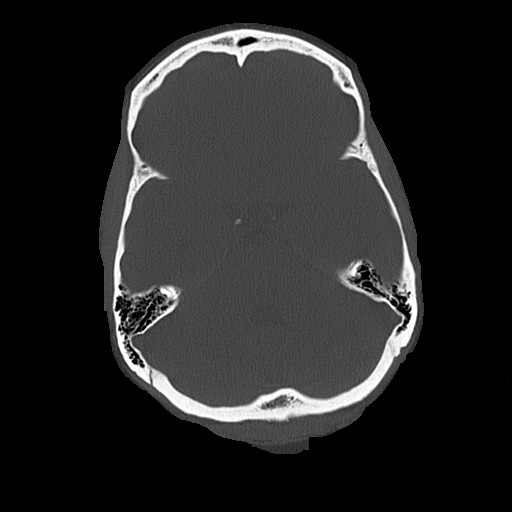
[im 25/84  bone]
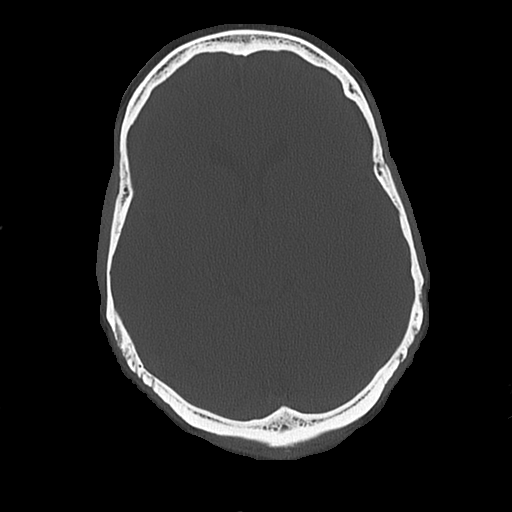
[im 38/84  bone]
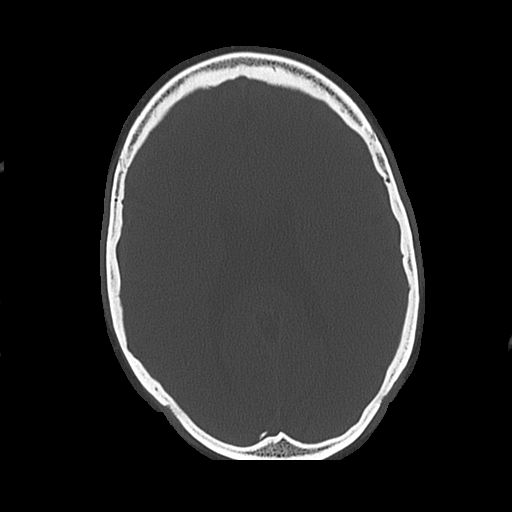

[Series 4: coronal soft · coronal · 0.34mm/px · 3 of 70 slices shown]
[im 24/70  brain]
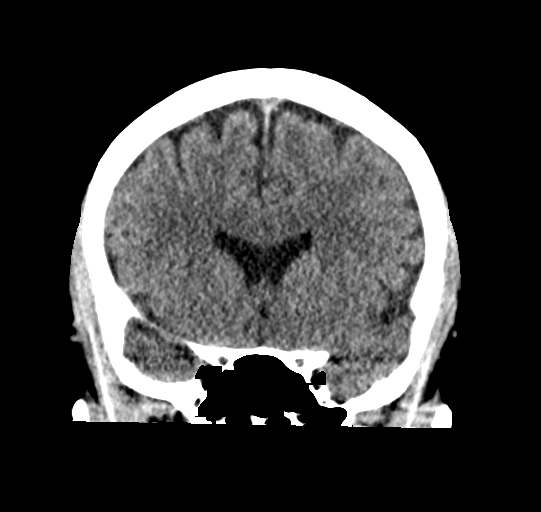
[im 31/70  brain]
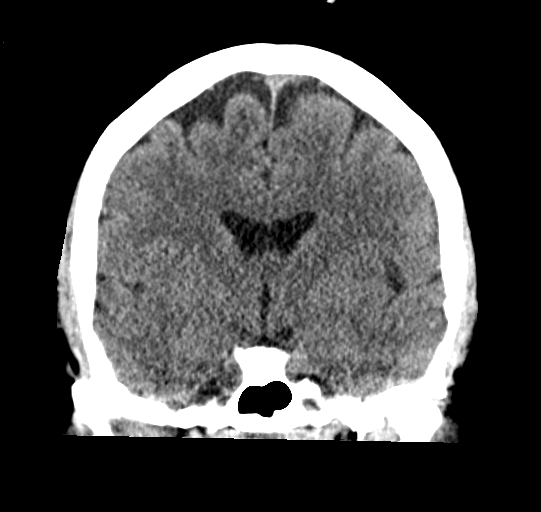
[im 39/70  brain]
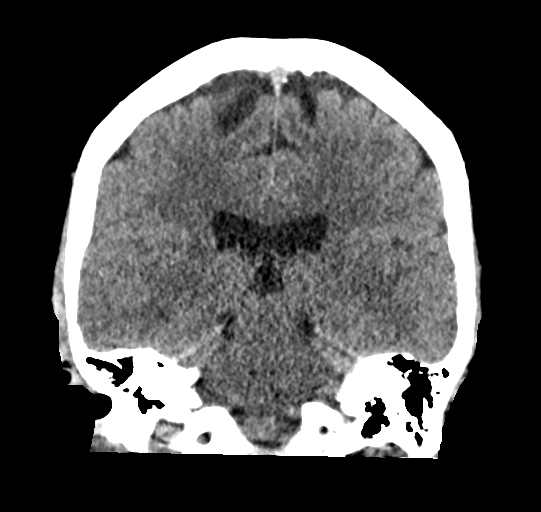

[Series 5: sagittal soft · sagittal · 0.35mm/px · 3 of 52 slices shown]
[im 18/52  brain]
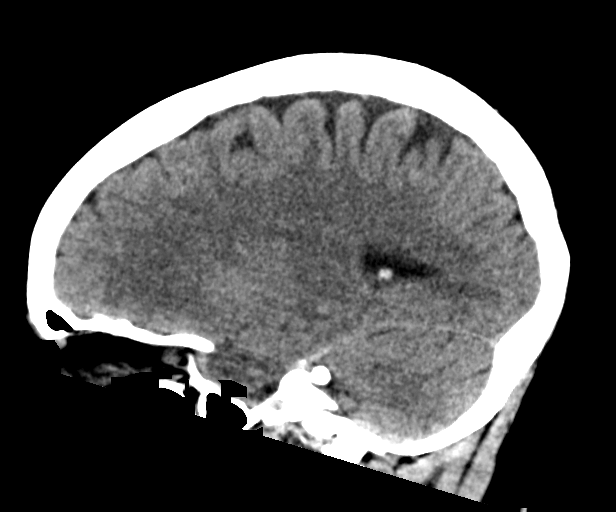
[im 26/52  brain]
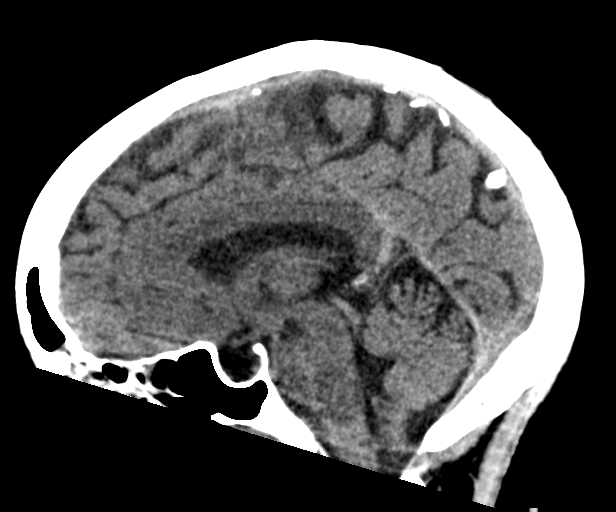
[im 35/52  brain]
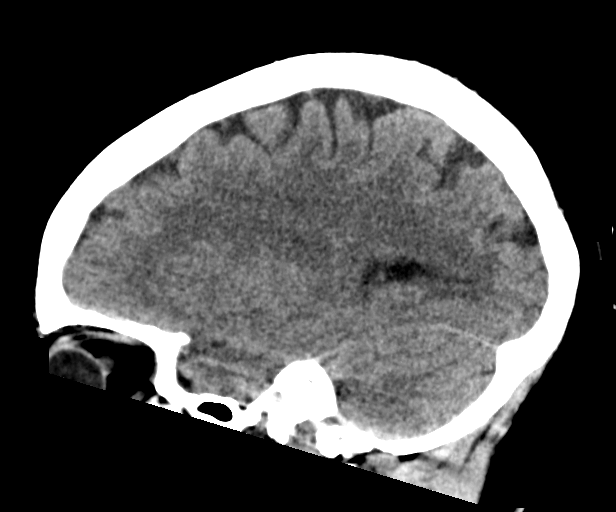

[17 of 47 positions shown; findings below may reference images not displayed]

FINDINGS: Brain: No evidence of acute infarction, hemorrhage, hydrocephalus,
extra-axial collection or mass lesion/mass effect.

Vascular: No hyperdense vessel or unexpected calcification.

Skull: Normal. Negative for fracture or focal lesion.

Sinuses/Orbits: Visualized globes and orbits are unremarkable. There
is mild mucosal thickening lining the ethmoid air cells and left
frontal sinus.

Other: None.
IMPRESSION: 1. No intracranial abnormality.

## 2022-12-23 ENCOUNTER — Ambulatory Visit (INDEPENDENT_AMBULATORY_CARE_PROVIDER_SITE_OTHER): Payer: 59 | Admitting: Clinical

## 2022-12-23 DIAGNOSIS — F419 Anxiety disorder, unspecified: Secondary | ICD-10-CM | POA: Diagnosis not present

## 2022-12-23 NOTE — Progress Notes (Signed)
[facility] Counselor/Therapist Progress Note  Patient ID: Lauren Guerrero, MRN: [id number]    Date: 12/23/22  Time Spent: 9:01 am - 9:59 am: 58 Minutes  Type of Service Provided Individual Therapy  Type of Contact virtual (via [employer] with real time audio and visual interaction)  Patient Location: home       Provider Location: office  Lauren Guerrero participated from home, via video, and consented to treatment. Therapist participated from office.   Mental Status Exam: Appearance:  Neat and Well Groomed     Behavior: Appropriate and Sharing  Motor: Normal  Speech/Language:  Clear and Coherent and Normal Rate  Affect: Appropriate and Congruent, at times tearful  Mood: normal  Thought process: normal  Thought content:   WNL  Sensory/Perceptual disturbances:   WNL  Orientation: oriented to person, place, time/date, and situation  Attention: Good  Concentration: Good  Memory: WNL  Fund of knowledge:  Good  Insight:   Good  Judgment:  Good  Impulse Control: Good   Risk Assessment: No apparent indicators of SI or HI during the visit  Presenting Problems, Reported Symptoms, and /or Interim History: Lauren Guerrero presented for a session to address life stress.   Subjective: Lauren Guerrero presented for an individual outpatient therapy session. The following was addressed during sessions.   Lauren Guerrero and therapist have periodically updated/reviewed the goals but during today's session, formally reviewed goals and Lauren Guerrero was sent the treatment plan form to sign. Lauren Guerrero agreed with the goals .  Lauren Guerrero rated her mood as a 10 out of 10 (with 1 being sad, 5 being neutral, and 10 being happy). Lauren Guerrero rated her Guerrero as a 2 out of 10 and irritation/frustration as a variable (ranging from 1-8 between visits) (with 10 being high). Lauren Guerrero reported that she was able to manage the one year anniversary of her father's death appropriately. Life stress associated with her children  and strategies to better manage that were discussed. Lauren Guerrero discussed the desire to feel like a more "well rounded" person. She has been putting herself in more social settings but has had difficulty with feeling able to share about herself and connect with others. What may be getting in the way of her being able to do this was discussed, as were strategies that she can use to overcome this.     Interventions/Psychotherapy Techniques Used During Session: Cognitive Behavioral Therapy, Assertiveness/Communication, and Insight-Oriented  Diagnosis: Guerrero  MENTAL HEALTH INTERVENTIONS USED DURING TREATMENT & PATIENT'S RESPONSE TO INTERVENTIONS:  Short-term Objective addressed today: Develop a range of coping mechanisms to address negative thinking patterns and physiological symptoms of Guerrero. Mental health techniques used: Objective was addressed in session through the use of Cognitive Behavioral Therapy and Insight-Oriented, and discussion. [name]'s response was positive.  Progress Toward Goal: progressing  Short-term Objective addressed today:Lauren Guerrero will expand her social circle and practice effective communication with these people  Mental health techniques used: Objective was addressed in session through the use of Cognitive Behavioral Therapy and Assertiveness/Communication, and discussion. Lauren Guerrero's response was positive.  Progress Toward Goal: progressing    PLAN  1. Lauren Guerrero will return for a therapy session.   2. Homework Given:  continue to explore activities that she may want to become involved with, when she feels herself holding back in a social exchange try to determine why and explore what to do the next time. This homework will be reviewed with Lauren Guerrero at the next visit.  3. During the next session check in on mood and Guerrero.  Lauren Guerrero   Individual Treatment Plan - please see the note from 10/22/2021 for complete treatment plan information  - Goals  formally reviewed and updated with Lauren Guerrero on 12/23/2022 and Lauren Guerrero.   Prior Goals:   Problem/Need: Guerrero - Lauren Guerrero which is exacerbated by the hectic schedule and her expectations of herself.   Long-Term Goal #1: Reduce overall level, frequency, and intensity of the feelings of Guerrero evidenced by decreased Guerrero symptoms and stress level per client report  Short-Term Objectives: Objective 1A: Lauren Guerrero will be able to measure her Guerrero and stress level within 1 month. - GOAL MET Objective 1B: Lauren Guerrero will be able to describe the link between thoughts-feelings-and actions and identify the major components of her Guerrero/stress within 3 months. - GOAL MET Objective 1C: Lauren Guerrero will be able to identify negative thoughts that are contributing to or perpetuating Guerrero/stress and identify at least one alternative thought within 6 months. (Verbalize an understanding of the role that distorted thinking plays in creating fears, excessive worry, and ruminations). - Goal Met  Objective 1D: Develop a range of coping mechanisms to address negative thinking patterns and physiological symptoms of Guerrero. - Goal Met Interventions: Cognitive Behavioral Therapy, Motivational Interviewing, and Psycho-education/Bibliotherapy  coping skills, and other evidenced-based practices will be used to promote progress towards healthy functioning and to help manage decrease symptoms associated with their diagnosis.  Treatment Regimen: Individual  skill building sessions every 2-3 weeks for addressing treatment goal/objective Target Date: target date changed to 10/2022 Responsible Party: therapist and patient Person delivering treatment: Licensed Psychologist Lauren Guerrero the patient's ability to achieve the goals identified. Resolved: Partially - Lauren Guerrero has met objectives A and B, and has made substantial progress with C and D, but goal will continue to  be addressed in session until can be moved to a full maintenance phase. - Goal met as of 12/23/2022   Problem/Need: communication challenge - Lauren Guerrero has had some challenges with communicating with her family members.  Long-Term Goal #2: Improve communication between Lauren Guerrero and her family members.  Short-Term Objectives: Objective 2A: Lauren Guerrero will be able to identify the goals of her communication with her family members. - Goal Met Objective 2B: Lauren Guerrero will be able to identify breakdowns in communication within 4 months. - Goal Met  Objective 2C: Lauren Guerrero will be able to implement more effective communication with her family members . - Goal partially met Interventions: Cognitive Behavioral Therapy, Assertiveness/Communication, and Motivational Interviewing , and other evidenced-based practices will be used to promote progress towards healthy functioning and to help manage decrease symptoms associated with their diagnosis.  Treatment Regimen: Individual  skill building sessions every 2-3 weeks to address treatment goal/objective Target Date: target date changed to 10/2022 Responsible Party: therapist and patient Person delivering treatment: Licensed Psychologist Lauren Guerrero the patient's ability to achieve the goals identified. Resolved: No - Kirstie has made strides with communication with some of her family members but there continue to be things to work on in her communication with others  Problem/Need: Grief - Lauren Guerrero experienced the sudden loss of her father between the intake and initial therapy visit.  Long-Term Goal #3: Lauren Guerrero will be able to process her grief.  Short-Term Objectives: Objective 3A: Lauren Guerrero will be able to talk about her feelings related to grief. _ GOAL MET Objective 3B: Lauren Guerrero will be able to identify and make use of supports to help her process her grief and manage her emotions. - GOAL  MET Interventions: Cognitive Behavioral Therapy,  Motivational Interviewing, and Grief Therapy , and other evidenced-based practices will be used to promote progress towards healthy functioning and to help manage decrease symptoms associated with their diagnosis.  Treatment Regimen: Individual  skill building sessions every 2-3 weeks to address treatment goal/objective Target Date: 06/2022 - initial goals met Responsible Party: therapist and patient Person delivering treatment: Licensed Psychologist Lauren Guerrero the patient's ability to achieve the goals identified. Resolved: Short-term objectives have been met, but as grief is not a linear process grief and exploration of grief may continue to be a part of sessions in the future.  Therefore, the long-term goal will remain though there are no active short-term goals associated with this at this time.    New Goals as of 12/23/2022  Problem/Need: Guerrero - Lauren Guerrero continues to experience Guerrero at times, she has shown growth in this area but there is some ongoing Guerrero  Long-Term Goal #1: Maintain appropriate levels of Guerrero and stress level per client report   Short-Term Objectives: Objective 1A: Lauren Guerrero will continue to monitor her mood and Guerrero Objective 1B: Lauren Guerrero will be recognize when she is experiencing elevated Guerrero or mood concerns and use her coping resources to better manage this Objective 1C: Lauren Guerrero will recognize when Guerrero is causing avoidance and continue to engage with (rather than avoid) reasonable Guerrero-provoking situations.  Interventions: Cognitive Behavioral Therapy, Motivational Interviewing, and Psycho-education/Bibliotherapy  coping skills, and other evidenced-based practices will be used to promote progress towards healthy functioning and to help manage decrease symptoms associated with their diagnosis.  Treatment Regimen: Individual  skill building sessions every 2-3 weeks for addressing treatment goal/objective Target Date:  11/2023 Responsible Party: therapist and patient Person delivering treatment: Licensed Psychologist Lauren Guerrero the patient's ability to achieve the goals identified. Resolved: N/A new goal as of 12/23/2022   Problem/Need: communication challenge - Lauren Guerrero has had some challenges with communicating with her family members and others.  Long-Term Goal #2: Improve communication between Lauren Guerrero and her family members and other  Short-Term Objectives: Objective 2A: Lauren Guerrero will be able to identify the goals of her communication with her family members. - Goal Met Objective 2B: Lauren Guerrero will be able to identify breakdowns in communication - Goal Met Objective 2C: Lauren Guerrero will be able to implement more effective communication with her family members  Objective 2D: Lauren Guerrero will expand her social circle and practice effective communication with these people  Interventions: Cognitive Behavioral Therapy, Assertiveness/Communication, and Motivational Interviewing , and other evidenced-based practices will be used to promote progress towards healthy functioning and to help manage decrease symptoms associated with their diagnosis.  Treatment Regimen: Individual  skill building sessions every 2-3 weeks to address treatment goal/objective Target Date: 11/2023 Responsible Party: therapist and patient Person delivering treatment: Licensed Psychologist Lauren Guerrero the patient's ability to achieve the goals identified. Resolved: No - Shaquaya has made strides with communication with some of her family members but there continue to be things to work on in her communication with others  Problem/Need: Grief - Merleen experienced the sudden loss of her father between the intake and initial therapy visit.  Long-Term Goal #3: Zafina will be able to process her grief.  Resolved: Short-term objectives have been met, but as grief is not a linear process grief and exploration of  grief may continue to be a part of sessions in the future.  Therefore, the long-term goal will remain though there are no active  short-term goals associated with this at this time.     Problem/Need: Life transition  Pablo expressed a desire to become a more "well-rounded" person and to determine her identity outside of the role of parent and spouse   Long-Term Goal #4: Lauren Guerrero will explore avenues for personal growth.   Short-Term Objectives: Objective 4A: Yensi will explore activities that she would like to include in her daily, weekly, and/or monthly schedule  Objective 4B: Emylia will begin further develop and explore her overall identity  Interventions: Cognitive Behavioral Therapy, Motivational Interviewing, and Psycho-education/Bibliotherapy  coping skills, and other evidenced-based practices will be used to promote progress towards healthy functioning and to help manage decrease symptoms associated with their diagnosis.  Treatment Regimen: Individual  skill building sessions every 2-3 weeks for addressing treatment goal/objective Target Date: 11/2023 Responsible Party: therapist and patient Person delivering treatment: Licensed Psychologist Lauren Guerrero the patient's ability to achieve the goals identified. Resolved: N/A new goal as of 12/23/2022     [name] participated in treatment planning: _X_ contributed to goals and plan _X_ aware of plan content __ reviewed written plan __ refused to participate __ unable to participate because _________________________________________   Progress and treatment plan will be reviewed periodically (at least every 12 months, or sooner if needed). This treatment plan was formally reviewed with the client 12/23/2022 and  the form was sent to be signed.   Lauren Guerrero

## 2023-01-20 ENCOUNTER — Ambulatory Visit: Payer: 59 | Admitting: Clinical

## 2023-01-23 ENCOUNTER — Encounter: Payer: Self-pay | Admitting: Family Medicine

## 2023-01-23 ENCOUNTER — Ambulatory Visit (INDEPENDENT_AMBULATORY_CARE_PROVIDER_SITE_OTHER): Payer: 59 | Admitting: Family Medicine

## 2023-01-23 VITALS — BP 116/80 | HR 87 | Temp 98.0°F | Resp 17 | Ht 65.0 in | Wt 150.2 lb

## 2023-01-23 DIAGNOSIS — R109 Unspecified abdominal pain: Secondary | ICD-10-CM | POA: Diagnosis not present

## 2023-01-23 NOTE — Progress Notes (Signed)
   Subjective:    Patient ID: Lauren Guerrero, female    DOB: 11-03-1975, 47 y.o.   MRN: [id number]  HPI Abd pain- pt reports pain was episodic earlier this week.  R sided.  Seems to be associated w/ menstrual cycle this week.  Sxs started as dull pain at the beginning of the month.  Pain became sharp- resolved w/ gas and BM's.  Had 2 separate illnesses this month- 1 from son, 1 from husband- and at that time bowels seemed to be moving more frequently.  At this time, bowel habits seem to have normalized.  Pain has currently resolved.  Pain described as an 'aching fullness'.  No TTP.  No CP, SOB, N/V.     Review of Systems For ROS see HPI     Objective:   Physical Exam Vitals reviewed.  Constitutional:      General: She is not in acute distress.    Appearance: She is well-developed. She is not ill-appearing.  HENT:     Head: Normocephalic and atraumatic.  Eyes:     Conjunctiva/sclera: Conjunctivae normal.     Pupils: Pupils are equal, round, and reactive to light.  Neck:     Thyroid: No thyromegaly.  Cardiovascular:     Rate and Rhythm: Normal rate and regular rhythm.     Heart sounds: Normal heart sounds. No murmur heard. Pulmonary:     Effort: Pulmonary effort is normal. No respiratory distress.     Breath sounds: Normal breath sounds.  Abdominal:     General: Bowel sounds are normal. There is no distension.     Palpations: Abdomen is soft.     Tenderness: There is no abdominal tenderness. There is no guarding or rebound.  Musculoskeletal:     Cervical back: Normal range of motion and neck supple.  Lymphadenopathy:     Cervical: No cervical adenopathy.  Skin:    General: Skin is warm and dry.  Neurological:     General: No focal deficit present.     Mental Status: She is alert and oriented to person, place, and time.  Psychiatric:        Mood and Affect: Mood normal.        Behavior: Behavior normal.           Assessment & Plan:  R sided abdominal pain- new, but  resolved.  Initially had dull pain associated w/ menstrual cycle that became sharp- but resolved w/ passing gas and BM.  Thankfully at this time, pain has resolved and bowel habits have normalized.  Suspect the changes in bowel habits were due to recent illnesses, menses, and change in diet.  No need for workup at this time since sxs have resolved.  Pt expressed understanding and is in agreement w/ plan.

## 2023-01-23 NOTE — Patient Instructions (Signed)
Follow up as needed or as scheduled I think the abdominal pain was a perfect storm of things- a change in the diet, multiple illnesses, menstrual cycle- all of which can alter bowel habits and gas Monitor your symptoms for now- but it is very reassuring that the symptoms improve w/ bowel movements or passing gas Call with any questions or concerns Stay Safe!  Stay Healthy! Have a great summer!!!

## 2023-03-19 DIAGNOSIS — K581 Irritable bowel syndrome with constipation: Secondary | ICD-10-CM | POA: Diagnosis not present

## 2023-03-19 DIAGNOSIS — R5383 Other fatigue: Secondary | ICD-10-CM | POA: Diagnosis not present

## 2023-03-19 DIAGNOSIS — E611 Iron deficiency: Secondary | ICD-10-CM | POA: Diagnosis not present

## 2023-03-19 DIAGNOSIS — L659 Nonscarring hair loss, unspecified: Secondary | ICD-10-CM | POA: Diagnosis not present

## 2023-03-19 DIAGNOSIS — E559 Vitamin D deficiency, unspecified: Secondary | ICD-10-CM | POA: Diagnosis not present

## 2023-03-19 DIAGNOSIS — E041 Nontoxic single thyroid nodule: Secondary | ICD-10-CM | POA: Diagnosis not present

## 2023-03-19 DIAGNOSIS — Z131 Encounter for screening for diabetes mellitus: Secondary | ICD-10-CM | POA: Diagnosis not present

## 2023-03-19 DIAGNOSIS — E538 Deficiency of other specified B group vitamins: Secondary | ICD-10-CM | POA: Diagnosis not present

## 2023-03-19 DIAGNOSIS — R4189 Other symptoms and signs involving cognitive functions and awareness: Secondary | ICD-10-CM | POA: Diagnosis not present

## 2023-03-19 DIAGNOSIS — R140 Abdominal distension (gaseous): Secondary | ICD-10-CM | POA: Diagnosis not present

## 2023-03-24 ENCOUNTER — Encounter: Payer: Self-pay | Admitting: Family Medicine

## 2023-03-24 ENCOUNTER — Ambulatory Visit: Payer: 59 | Admitting: Family Medicine

## 2023-03-24 VITALS — BP 106/64 | HR 101 | Temp 97.9°F | Resp 17 | Ht 65.0 in | Wt 148.2 lb

## 2023-03-24 DIAGNOSIS — M25522 Pain in left elbow: Secondary | ICD-10-CM

## 2023-03-24 DIAGNOSIS — R109 Unspecified abdominal pain: Secondary | ICD-10-CM | POA: Diagnosis not present

## 2023-03-24 NOTE — Progress Notes (Signed)
   Subjective:    Patient ID: Lauren Guerrero, female    DOB: 1975-08-21, 47 y.o.   MRN: [id number]  HPI Abd pain- pt is still R sided, seems to correlate w/ menstrual cycles.  Pain is located between lower ribs and waistline.  Pain is described as a dull aching.  Pain develops a day or so prior to menses and is present during her cycle.  Pain will ease and then resolve following cycle.     Review of Systems For ROS see HPI     Objective:   Physical Exam Vitals reviewed.  Constitutional:      General: She is not in acute distress.    Appearance: She is well-developed. She is not ill-appearing.  HENT:     Head: Normocephalic and atraumatic.  Abdominal:     General: Abdomen is flat. Bowel sounds are normal. There is no distension.     Palpations: Abdomen is soft. There is no mass.     Tenderness: There is no abdominal tenderness. There is no guarding or rebound. Negative signs include Murphy's sign and McBurney's sign.  Skin:    General: Skin is warm and dry.  Neurological:     General: No focal deficit present.     Mental Status: She is alert and oriented to person, place, and time.  Psychiatric:        Mood and Affect: Mood normal.        Behavior: Behavior normal.           Assessment & Plan:  R sided abd pain- ongoing issue for pt.  She has determined that there appears to be a correlation w/ menses.  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  Will get [place] to assess.  Will also get labs to r/o biliary issue.  We may need to bring in GYN given the cyclical nature of her pain.  Reviewed supportive care and red flags that should prompt return.  Pt expressed understanding and is in agreement w/ plan.

## 2023-03-24 NOTE — Patient Instructions (Signed)
Follow up as needed or as scheduled We'll notify you of your lab results and make any changes if needed We'll call you to schedule your ultrasound Call with any questions or concerns Stay Safe!  Stay Healthy! Hang in there!!!

## 2023-03-25 ENCOUNTER — Other Ambulatory Visit: Payer: 59

## 2023-03-27 ENCOUNTER — Other Ambulatory Visit (INDEPENDENT_AMBULATORY_CARE_PROVIDER_SITE_OTHER): Payer: 59

## 2023-03-27 DIAGNOSIS — R109 Unspecified abdominal pain: Secondary | ICD-10-CM

## 2023-03-27 LAB — CBC WITH DIFFERENTIAL/PLATELET
Basophils Absolute: 0 10*3/uL (ref 0.0–0.1)
Basophils Relative: 0.8 % (ref 0.0–3.0)
Eosinophils Absolute: 0.1 10*3/uL (ref 0.0–0.7)
Eosinophils Relative: 2.2 % (ref 0.0–5.0)
HCT: 37.3 % (ref 36.0–46.0)
Hemoglobin: 12.3 g/dL (ref 12.0–15.0)
Lymphocytes Relative: 24.4 % (ref 12.0–46.0)
Lymphs Abs: 1.1 10*3/uL (ref 0.7–4.0)
MCHC: 32.9 g/dL (ref 30.0–36.0)
MCV: 88.1 fl (ref 78.0–100.0)
Monocytes Absolute: 0.3 10*3/uL (ref 0.1–1.0)
Monocytes Relative: 6 % (ref 3.0–12.0)
Neutro Abs: 3 10*3/uL (ref 1.4–7.7)
Neutrophils Relative %: 66.6 % (ref 43.0–77.0)
Platelets: 256 10*3/uL (ref 150.0–400.0)
RBC: 4.24 Mil/uL (ref 3.87–5.11)
RDW: 13.8 % (ref 11.5–15.5)
WBC: 4.5 10*3/uL (ref 4.0–10.5)

## 2023-03-27 LAB — HEPATIC FUNCTION PANEL
ALT: 14 U/L (ref 0–35)
AST: 21 U/L (ref 0–37)
Albumin: 4.1 g/dL (ref 3.5–5.2)
Alkaline Phosphatase: 43 U/L (ref 39–117)
Bilirubin, Direct: 0.1 mg/dL (ref 0.0–0.3)
Total Bilirubin: 0.5 mg/dL (ref 0.2–1.2)
Total Protein: 6.5 g/dL (ref 6.0–8.3)

## 2023-03-27 LAB — AMYLASE: Amylase: 52 U/L (ref 27–131)

## 2023-03-27 LAB — LIPASE: Lipase: 12 U/L (ref 11.0–59.0)

## 2023-03-31 ENCOUNTER — Ambulatory Visit (INDEPENDENT_AMBULATORY_CARE_PROVIDER_SITE_OTHER): Payer: 59 | Admitting: Clinical

## 2023-03-31 ENCOUNTER — Telehealth: Payer: Self-pay

## 2023-03-31 DIAGNOSIS — F419 Anxiety disorder, unspecified: Secondary | ICD-10-CM

## 2023-03-31 DIAGNOSIS — Z124 Encounter for screening for malignant neoplasm of cervix: Secondary | ICD-10-CM | POA: Diagnosis not present

## 2023-03-31 DIAGNOSIS — Z01419 Encounter for gynecological examination (general) (routine) without abnormal findings: Secondary | ICD-10-CM | POA: Diagnosis not present

## 2023-03-31 NOTE — Telephone Encounter (Signed)
Pt aware of lab results 

## 2023-03-31 NOTE — Telephone Encounter (Signed)
-----   Message from Neena Rhymes sent at 03/31/2023  7:23 AM EDT ----- Labs look great!  No cause for abdominal pain identified

## 2023-03-31 NOTE — Progress Notes (Signed)
[facility] Counselor/Therapist Progress Note  Patient ID: Lauren Guerrero, MRN: [id number]    Date: 03/31/23  Time Spent: 9:00 am - 10:03 pm: 63 Minutes  Type of Service Provided Individual Therapy   Type of Contact virtual (via Caregility with real time audio and visual interaction)  Patient Location: home       Provider Location: office  Lauren Guerrero participated from home, via video, and consented to treatment. Therapist participated from office.  Patient consented to telehealth therapy and is aware and consented to the limitations of telehealth.   Mental Status Exam: Appearance:  Casual and Well Groomed     Behavior: Sharing  Motor: Normal  Speech/Language:  Clear and Coherent and Normal Rate  Affect: Appropriate  Mood: normal  Thought process: normal  Thought content:   WNL  Sensory/Perceptual disturbances:   WNL  Orientation: oriented to person, place, time/date, and situation  Attention: Good  Concentration: Good  Memory: WNL  Fund of knowledge:  Good  Insight:   Good  Judgment:  Good  Impulse Control: Good   Risk Assessment: No apparent indicators of SI or HI during the visit  Presenting Problems, Reported Symptoms, and /or Interim History: Lauren Guerrero presented for a session to address life stress, anxiety, communication challenges, and executive functioning challenges.   Subjective: Lauren Guerrero presented for an individual outpatient therapy session. The following was addressed during sessions.   Lauren Guerrero rated her mood as a 8-9 out of 10 (with 1 being sad, 5 being neutral, and 10 being happy).  Lauren Guerrero rated her anxiety as a 1 out of 10 (with 10 being high).  Her irritation/frustration has reduced over time. Lauren Guerrero reported that she had some successful conversations with her mother and has made some positive progress in that area. She also made some progress in streamlining things at home and strategies for maintaining her organization were dicussed.   Some ongoing concerns with her connection with her spouse were discussed, along with ways to reframe certain thoughts/interactions (e.g., communicating in a new way may feel uncomfortable at first but the feeling does not mean that something is wrong).   Interventions/Psychotherapy Techniques Used During Session: Cognitive Behavioral Therapy, Assertiveness/Communication, and Social Skills Training  Diagnosis: Anxiety  MENTAL HEALTH INTERVENTIONS USED DURING TREATMENT & PATIENT'S RESPONSE TO INTERVENTIONS:  Short-term Objective addressed today:Lauren Guerrero will be recognize when she is experiencing elevated anxiety or mood concerns and use her coping resources to better manage this AND Lauren Guerrero will recognize when anxiety is causing avoidance and continue to engage with (rather than avoid) reasonable anxiety-provoking situations. Mental health techniques used: Objective was addressed in session through the use of Cognitive Behavioral Therapy and Assertiveness/Communication and discussion. Lauren Guerrero's response was positive.  Progress Toward Goal: progressing   Short-term Objective addressed today:Lauren Guerrero will be able to implement more effective communication with her family members. Mental health techniques used: Objective was addressed in session through the use of Assertiveness/Communication and Psychologist, occupational and discussion.  Progress Toward Goal: progressing     PLAN  1. Lauren Guerrero will return for a therapy session.   2. Homework Given:  continue to try to expand on interactions with husband, practice sharing feelings, work to find 15 or so technology free minutes to connect, look for opportunities for family fun, continue to work on de-cluttering. This homework will be reviewed with Lauren Guerrero at the next visit.  3. During the next session check in on communication, anxiety, social interactions, .     Lauren Guerrero, Lauren Guerrero  Individual Treatment Plan - please see the note from 10/22/2021 for  complete treatment plan information and the note from 12/23/2022 for updated information.    New Goals as of 12/23/2022  Problem/Need: Anxiety - [name] continues to experience anxiety at times, she has shown growth in this area but there is some ongoing anxiety  Long-Term Goal #1: Maintain appropriate levels of anxiety and stress level per client report   Short-Term Objectives: Objective 1A: [name] will continue to monitor her mood and anxiety Objective 1B: [name] will be recognize when she is experiencing elevated anxiety or mood concerns and use her coping resources to better manage this Objective 1C: [name] will recognize when anxiety is causing avoidance and continue to engage with (rather than avoid) reasonable anxiety-provoking situations.  Interventions: Cognitive Behavioral Therapy, Motivational Interviewing, and Psycho-education/Bibliotherapy  coping skills, and other evidenced-based practices will be used to promote progress towards healthy functioning and to help manage decrease symptoms associated with their diagnosis.  Treatment Regimen: Individual  skill building sessions every 2-3 weeks for addressing treatment goal/objective Target Date: 11/2023 Responsible Party: therapist and patient Person delivering treatment: Licensed Psychologist Lauren Guerrero, Lauren Guerrero will support the patient's ability to achieve the goals identified. Resolved: N/A new goal as of 12/23/2022   Problem/Need: communication challenge - [name] has had some challenges with communicating with her family members and others.  Long-Term Goal #2: Improve communication between [name] and her family members and other  Short-Term Objectives: Objective 2A: [name] will be able to identify the goals of her communication with her family members. - Goal Met Objective 2B: [name] will be able to identify breakdowns in communication - Goal Met Objective 2C: [name] will be able to implement more effective  communication with her family members  Objective 2D: [name] will expand her social circle and practice effective communication with these people  Interventions: Cognitive Behavioral Therapy, Assertiveness/Communication, and Motivational Interviewing , and other evidenced-based practices will be used to promote progress towards healthy functioning and to help manage decrease symptoms associated with their diagnosis.  Treatment Regimen: Individual  skill building sessions every 2-3 weeks to address treatment goal/objective Target Date: 11/2023 Responsible Party: therapist and patient Person delivering treatment: Licensed Psychologist Lauren Guerrero, Lauren Guerrero will support the patient's ability to achieve the goals identified. Resolved: No - Porsche has made strides with communication with some of her family members but there continue to be things to work on in her communication with others  Problem/Need: Grief - Jaime experienced the sudden loss of her father between the intake and initial therapy visit.  Long-Term Goal #3: Karin will be able to process her grief.  Resolved: Short-term objectives have been met, but as grief is not a linear process grief and exploration of grief may continue to be a part of sessions in the future.  Therefore, the long-term goal will remain though there are no active short-term goals associated with this at this time.     Problem/Need: Life transition  Lourene expressed a desire to become a more "well-rounded" person and to determine her identity outside of the role of parent and spouse   Long-Term Goal #4: Hasna will explore avenues for personal growth.   Short-Term Objectives: Objective 4A: Asly will explore activities that she would like to include in her daily, weekly, and/or monthly schedule  Objective 4B: Kaybri will begin further develop and explore her overall identity  Interventions: Cognitive Behavioral Therapy, Motivational Interviewing, and  Psycho-education/Bibliotherapy  coping skills, and other evidenced-based practices will be used  to promote progress towards healthy functioning and to help manage decrease symptoms associated with their diagnosis.  Treatment Regimen: Individual  skill building sessions every 2-3 weeks for addressing treatment goal/objective Target Date: 11/2023 Responsible Party: therapist and patient Person delivering treatment: Licensed Psychologist Lauren Guerrero, Lauren Guerrero will support the patient's ability to achieve the goals identified. Resolved: N/A new goal as of 12/23/2022   Lauren Guerrero, Lauren Guerrero

## 2023-04-08 ENCOUNTER — Telehealth: Payer: Self-pay

## 2023-04-08 ENCOUNTER — Ambulatory Visit (HOSPITAL_COMMUNITY)
Admission: RE | Admit: 2023-04-08 | Discharge: 2023-04-08 | Disposition: A | Discharge status: Home / Self Care | Payer: 59 | Source: Ambulatory Visit | Attending: Family Medicine | Admitting: Family Medicine

## 2023-04-08 ENCOUNTER — Other Ambulatory Visit: Payer: Self-pay | Admitting: Family Medicine

## 2023-04-08 DIAGNOSIS — R109 Unspecified abdominal pain: Secondary | ICD-10-CM | POA: Insufficient documentation

## 2023-04-08 DIAGNOSIS — R935 Abnormal findings on diagnostic imaging of other abdominal regions, including retroperitoneum: Secondary | ICD-10-CM | POA: Diagnosis not present

## 2023-04-08 NOTE — Telephone Encounter (Signed)
-----   Message from Neena Rhymes sent at 04/08/2023 12:26 PM EDT ----- No issues seen on CT scan that would account for R sided pain.  This is great news!

## 2023-04-09 NOTE — Telephone Encounter (Signed)
Pt reports she has had no sx since 03/30/2023 Pt has been notified

## 2023-04-13 ENCOUNTER — Other Ambulatory Visit (HOSPITAL_COMMUNITY): Payer: Self-pay

## 2023-04-13 MED ORDER — FLUCONAZOLE 100 MG PO TABS
100.0000 mg | ORAL_TABLET | Freq: Every day | ORAL | 0 refills | Status: DC
  Filled 2023-04-13: qty 90, 90d supply, fill #0

## 2023-04-14 ENCOUNTER — Other Ambulatory Visit (HOSPITAL_COMMUNITY): Payer: Self-pay

## 2023-04-14 MED ORDER — PRAZIQUANTEL 600 MG PO TABS
600.0000 mg | ORAL_TABLET | Freq: Every day | ORAL | 0 refills | Status: DC
  Filled 2023-04-14: qty 2, 2d supply, fill #0

## 2023-04-15 ENCOUNTER — Other Ambulatory Visit (HOSPITAL_COMMUNITY): Payer: Self-pay

## 2023-04-16 ENCOUNTER — Other Ambulatory Visit (HOSPITAL_COMMUNITY): Payer: Self-pay

## 2023-04-20 ENCOUNTER — Other Ambulatory Visit (HOSPITAL_COMMUNITY): Payer: Self-pay

## 2023-04-20 NOTE — Telephone Encounter (Signed)
Pt requesting referral to PT for elbow pain, please advise as it appears this was not discussed last visit, should patient make another visit?

## 2023-04-28 ENCOUNTER — Ambulatory Visit: Payer: 59 | Admitting: Clinical

## 2023-05-26 ENCOUNTER — Ambulatory Visit (INDEPENDENT_AMBULATORY_CARE_PROVIDER_SITE_OTHER): Payer: 59 | Admitting: Clinical

## 2023-05-26 DIAGNOSIS — F419 Anxiety disorder, unspecified: Secondary | ICD-10-CM | POA: Diagnosis not present

## 2023-05-26 NOTE — Progress Notes (Signed)
[facility] Counselor/Therapist Progress Note  Patient ID: Lauren Guerrero, MRN: [id number]    Date: 05/26/23  Time Spent: 1:01 pm - 2:00 pm: 59 Minutes  Type of Service Provided Individual Therapy  Type of Contact virtual (via Caregility with real time audio and visual interaction)  Patient Location: home       Provider Location: office  Ashley Mariner Osterman participated from home, via video, and consented to treatment. Therapist participated from office.  Patient consented to telehealth therapy and is aware of and consented to the limitations of telehealth.   Mental Status Exam: Appearance:  Casual and Well Groomed     Behavior: Appropriate  Motor: Normal  Speech/Language:  Clear and Coherent and Normal Rate  Affect: Appropriate  Mood: normal, with a few brief moments of sadness  Thought process: normal  Thought content:   WNL  Sensory/Perceptual disturbances:   WNL  Orientation: oriented to person, place, time/date, and situation  Attention: Good  Concentration: Good  Memory: WNL  Fund of knowledge:  Good  Insight:   Fair to good  Judgment:  Good  Impulse Control: Good   Risk Assessment: No apparent indicators of SI or HI during the visit  Presenting Problems, Reported Symptoms, and /or Interim History: [name] presented for a session to address life stress and social interactions/communication.   Subjective: Lauren Guerrero presented for an individual outpatient therapy session. The following was addressed during sessions.   [name] rated her mood as a 10 out of 10 (with 1 being sad, 5 being neutral, and 10 being happy).  [name] rated her anxiety as a 3 out of 10 and irritation/frustration as a 2-3 out of 10 (with 10 being high). [name] reported that she has been engaging in more social activities which has provided more social opportunities, but she continues to struggle with communication at times.  Specifically, she finds herself falling into a role of  questioner in conversations rather than sharing things about herself, which Lauren Guerrero noted could be related to fears about being vulnerable and rejection.  The idea that she may be hypervigilant for small signs of negative feelings was discussed, as well as reviewing common cognitive distortions and discussing how these could be impacting her engagement during social exchanges.  For example, her tendency to jump to conclusions and engage in mind reading were discussed, with [name] indicating that she engages in these negative thinking patterns both when engaging with unfamiliar people as well as when she is engaging with her spouse.  Strategies to recognize these patterns and challenge them were discussed.    Interventions/Psychotherapy Techniques Used During Session: Cognitive Behavioral Therapy, Assertiveness/Communication, and Social Skills Training  Diagnosis: Anxiety  MENTAL HEALTH INTERVENTIONS USED DURING TREATMENT & PATIENT'S RESPONSE TO INTERVENTIONS:  Short-term Objective addressed today:Lauren Guerrero will be able to implement more effective communication with her family members AND [name] will expand her social circle and practice effective communication with these people  Mental health techniques used: Objective was addressed in session through the use of Cognitive Behavioral Therapy, Assertiveness/Communication, and Psychologist, occupational and discussion. Adalina's response was positive.  Progress Toward Goal: Progressing  Short-term Objective addressed today:[name] will be recognize when she is experiencing elevated anxiety or mood concerns and use her coping resources to better manage this AND [name] will recognize when anxiety is causing avoidance and continue to engage with (rather than avoid) reasonable anxiety-provoking situations.  Mental health techniques used: Objective was addressed in session through the use of  Cognitive Behavioral Therapy and  discussion. [name]'s response  was positive.  Progress Toward Goal: Progressing    PLAN  1. Anayelli will return for a therapy session.   2. Homework Given: Review the common cognitive distortions discussed during the visit and see when she is falling into these thinking patterns when engaged in social interactions. This homework will be reviewed with Lauren Guerrero at the next visit.  3. During the next session check-in on mood, anxiety, social interactions.     [name], PhD  Individual Treatment Plan - please see the note from 10/22/2021 for complete treatment plan information and the note from 12/23/2022 for updated information.    New Goals as of 12/23/2022  Problem/Need: Anxiety - [name] continues to experience anxiety at times, she has shown growth in this area but there is some ongoing anxiety  Long-Term Goal #1: Maintain appropriate levels of anxiety and stress level per client report   Short-Term Objectives: Objective 1A: Promiss will continue to monitor her mood and anxiety Objective 1B: Lauren Guerrero will be recognize when she is experiencing elevated anxiety or mood concerns and use her coping resources to better manage this Objective 1C: Lauren Guerrero will recognize when anxiety is causing avoidance and continue to engage with (rather than avoid) reasonable anxiety-provoking situations.  Interventions: Cognitive Behavioral Therapy, Motivational Interviewing, and Psycho-education/Bibliotherapy  coping skills, and other evidenced-based practices will be used to promote progress towards healthy functioning and to help manage decrease symptoms associated with their diagnosis.  Treatment Regimen: Individual  skill building sessions every 2-3 weeks for addressing treatment goal/objective Target Date: 11/2023 Responsible Party: therapist and patient Person delivering treatment: Licensed Psychologist [name], PhD will support the patient's ability to achieve the goals identified. Resolved: N/A new goal as of 12/23/2022    Problem/Need: communication challenge - Lauren Guerrero has had some challenges with communicating with her family members and others.  Long-Term Goal #2: Improve communication between Clinton and her family members and other  Short-Term Objectives: Objective 2A: Jozee will be able to identify the goals of her communication with her family members. - Goal Met Objective 2B: Tolulope will be able to identify breakdowns in communication - Goal Met Objective 2C: Katrenia will be able to implement more effective communication with her family members  Objective 2D: Kanijah will expand her social circle and practice effective communication with these people  Interventions: Cognitive Behavioral Therapy, Assertiveness/Communication, and Motivational Interviewing , and other evidenced-based practices will be used to promote progress towards healthy functioning and to help manage decrease symptoms associated with their diagnosis.  Treatment Regimen: Individual  skill building sessions every 2-3 weeks to address treatment goal/objective Target Date: 11/2023 Responsible Party: therapist and patient Person delivering treatment: Licensed Psychologist [name], PhD will support the patient's ability to achieve the goals identified. Resolved: No - Alexiyah has made strides with communication with some of her family members but there continue to be things to work on in her communication with others  Problem/Need: Grief - Tallulah experienced the sudden loss of her father between the intake and initial therapy visit.  Long-Term Goal #3: Ron will be able to process her grief.  Resolved: Short-term objectives have been met, but as grief is not a linear process grief and exploration of grief may continue to be a part of sessions in the future.  Therefore, the long-term goal will remain though there are no active short-term goals associated with this at this time.     Problem/Need: Life transition  Cynitha  expressed a desire to become a  more "well-rounded" person and to determine her identity outside of the role of parent and spouse   Long-Term Goal #4: Nandika will explore avenues for personal growth.   Short-Term Objectives: Objective 4A: Denajah will explore activities that she would like to include in her daily, weekly, and/or monthly schedule  Objective 4B: Indea will begin further develop and explore her overall identity  Interventions: Cognitive Behavioral Therapy, Motivational Interviewing, and Psycho-education/Bibliotherapy  coping skills, and other evidenced-based practices will be used to promote progress towards healthy functioning and to help manage decrease symptoms associated with their diagnosis.  Treatment Regimen: Individual  skill building sessions every 2-3 weeks for addressing treatment goal/objective Target Date: 11/2023 Responsible Party: therapist and patient Person delivering treatment: Licensed Psychologist [name], PhD will support the patient's ability to achieve the goals identified. Resolved: N/A new goal as of 12/23/2022    [name], PhD

## 2023-06-18 ENCOUNTER — Encounter: Payer: Self-pay | Admitting: Family Medicine

## 2023-06-18 ENCOUNTER — Ambulatory Visit: Payer: 59 | Admitting: Family Medicine

## 2023-06-18 VITALS — BP 102/60 | HR 78 | Temp 97.8°F | Ht 65.0 in | Wt 153.2 lb

## 2023-06-18 DIAGNOSIS — E559 Vitamin D deficiency, unspecified: Secondary | ICD-10-CM

## 2023-06-18 DIAGNOSIS — Z1159 Encounter for screening for other viral diseases: Secondary | ICD-10-CM

## 2023-06-18 DIAGNOSIS — E663 Overweight: Secondary | ICD-10-CM | POA: Diagnosis not present

## 2023-06-18 DIAGNOSIS — Z0000 Encounter for general adult medical examination without abnormal findings: Secondary | ICD-10-CM

## 2023-06-18 LAB — BASIC METABOLIC PANEL
BUN: 16 mg/dL (ref 6–23)
CO2: 30 meq/L (ref 19–32)
Calcium: 9.2 mg/dL (ref 8.4–10.5)
Chloride: 104 meq/L (ref 96–112)
Creatinine, Ser: 0.8 mg/dL (ref 0.40–1.20)
GFR: 88.31 mL/min (ref >60.00)
Glucose, Bld: 82 mg/dL (ref 70–99)
Potassium: 4.1 meq/L (ref 3.5–5.1)
Sodium: 139 meq/L (ref 135–145)

## 2023-06-18 LAB — CBC WITH DIFFERENTIAL/PLATELET
Basophils Absolute: 0 10*3/uL (ref 0.0–0.1)
Basophils Relative: 1.1 % (ref 0.0–3.0)
Eosinophils Absolute: 0.1 10*3/uL (ref 0.0–0.7)
Eosinophils Relative: 2.7 % (ref 0.0–5.0)
HCT: 38.1 % (ref 36.0–46.0)
Hemoglobin: 12.6 g/dL (ref 12.0–15.0)
Lymphocytes Relative: 31.9 % (ref 12.0–46.0)
Lymphs Abs: 1.3 10*3/uL (ref 0.7–4.0)
MCHC: 33.1 g/dL (ref 30.0–36.0)
MCV: 89.3 fL (ref 78.0–100.0)
Monocytes Absolute: 0.2 10*3/uL (ref 0.1–1.0)
Monocytes Relative: 4.3 % (ref 3.0–12.0)
Neutro Abs: 2.5 10*3/uL (ref 1.4–7.7)
Neutrophils Relative %: 60 % (ref 43.0–77.0)
Platelets: 275 10*3/uL (ref 150.0–400.0)
RBC: 4.27 Mil/uL (ref 3.87–5.11)
RDW: 13.4 % (ref 11.5–15.5)
WBC: 4.2 10*3/uL (ref 4.0–10.5)

## 2023-06-18 LAB — HEPATIC FUNCTION PANEL
ALT: 22 U/L (ref 0–35)
AST: 33 U/L (ref 0–37)
Albumin: 4.2 g/dL (ref 3.5–5.2)
Alkaline Phosphatase: 38 U/L — ABNORMAL LOW (ref 39–117)
Bilirubin, Direct: 0.1 mg/dL (ref 0.0–0.3)
Total Bilirubin: 0.4 mg/dL (ref 0.2–1.2)
Total Protein: 6.8 g/dL (ref 6.0–8.3)

## 2023-06-18 LAB — LIPID PANEL
Cholesterol: 160 mg/dL (ref 0–200)
HDL: 64.6 mg/dL (ref >39.00)
LDL Cholesterol: 89 mg/dL (ref 0–99)
NonHDL: 95.12
Total CHOL/HDL Ratio: 2
Triglycerides: 33 mg/dL (ref 0.0–149.0)
VLDL: 6.6 mg/dL (ref 0.0–40.0)

## 2023-06-18 LAB — TSH: TSH: 1.51 u[IU]/mL (ref 0.35–5.50)

## 2023-06-18 LAB — VITAMIN D 25 HYDROXY (VIT D DEFICIENCY, FRACTURES): VITD: 33.27 ng/mL (ref 30.00–100.00)

## 2023-06-18 NOTE — Assessment & Plan Note (Signed)
Pt's PE WNL.  UTD on pap, mammo, colonoscopy, Tdap.  Check labs.  Anticipatory guidance provided.  

## 2023-06-18 NOTE — Progress Notes (Signed)
   Subjective:    Patient ID: Lauren Guerrero, female    DOB: [birth year], 47 y.o.   MRN: [id number]  HPI CPE- UTD on colonoscopy, pap, mammo, Tdap.    Patient Care Team    Relationship Specialty Notifications Start End  Sheliah Hatch, MD PCP - General Family Medicine  10/05/17   Carrington Clamp, MD Consulting Physician Obstetrics and Gynecology  10/05/17     Health Maintenance  Topic Date Due   Hepatitis C Screening  Never done   INFLUENZA VACCINE  02/26/2023   COVID-19 Vaccine (2 - 2023-24 season) 03/29/2023   Cervical Cancer Screening (HPV/Pap Cotest)  03/25/2025   Colonoscopy  02/14/2027   DTaP/Tdap/Td (3 - Td or Tdap) 06/21/2028   HIV Screening  Completed   HPV VACCINES  Aged Out      Review of Systems Patient reports no vision/ hearing changes, adenopathy,fever, weight change,  persistant/recurrent hoarseness , swallowing issues, chest pain, palpitations, edema, persistant/recurrent cough, hemoptysis, dyspnea (rest/exertional/paroxysmal nocturnal), gastrointestinal bleeding (melena, rectal bleeding), abdominal pain, significant heartburn, bowel changes, GU symptoms (dysuria, hematuria, incontinence), Gyn symptoms (abnormal  bleeding, pain),  syncope, focal weakness, memory loss, numbness & tingling, skin/hair/nail changes, abnormal bruising or bleeding, anxiety, or depression.     Objective:   Physical Exam General Appearance:    Alert, cooperative, no distress, appears stated age  Head:    Normocephalic, without obvious abnormality, atraumatic  Eyes:    PERRL, conjunctiva/corneas clear, EOM's intact both eyes  Ears:    Normal TM's and external ear canals, both ears  Nose:   Nares normal, septum midline, mucosa normal, no drainage    or sinus tenderness  Throat:   Lips, mucosa, and tongue normal; teeth and gums normal  Neck:   Supple, symmetrical, trachea midline, no adenopathy;    Thyroid: known nodule  Back:     Symmetric, no curvature, ROM normal, no CVA tenderness   Lungs:     Clear to auscultation bilaterally, respirations unlabored  Chest Wall:    No tenderness or deformity   Heart:    Regular rate and rhythm, S1 and S2 normal, no murmur, rub   or gallop  Breast Exam:    Deferred to GYN  Abdomen:     Soft, non-tender, bowel sounds active all four quadrants,    no masses, no organomegaly  Genitalia:    Deferred to GYN  Rectal:    Extremities:   Extremities normal, atraumatic, no cyanosis or edema  Pulses:   2+ and symmetric all extremities  Skin:   Skin color, texture, turgor normal, no rashes or lesions.  Birth mark under L eye  Lymph nodes:   Cervical, supraclavicular, and axillary nodes normal  Neurologic:   CNII-XII intact, normal strength, sensation and reflexes    throughout          Assessment & Plan:

## 2023-06-18 NOTE — Patient Instructions (Signed)
Follow up in 1 year or as needed We'll notify you of your lab results and make any changes if needed Keep up the good work on healthy diet and regular exercise- you look great! Call with any questions or concerns Stay Safe! Stay Healthy! Happy Holidays!!! 

## 2023-06-19 ENCOUNTER — Telehealth: Payer: Self-pay

## 2023-06-19 LAB — HEPATITIS B SURFACE ANTIBODY,QUALITATIVE: Hep B S Ab: REACTIVE — AB

## 2023-06-19 LAB — HEPATITIS C ANTIBODY: Hepatitis C Ab: NONREACTIVE

## 2023-06-19 NOTE — Telephone Encounter (Signed)
Pt has been notified.

## 2023-06-19 NOTE — Telephone Encounter (Signed)
Left vm to call about labs

## 2023-06-19 NOTE — Telephone Encounter (Signed)
-----   Message from Neena Rhymes sent at 06/18/2023  3:23 PM EST ----- Labs are FANTASTIC!  Keep up the good work!

## 2023-06-22 ENCOUNTER — Telehealth: Payer: Self-pay

## 2023-06-22 NOTE — Telephone Encounter (Signed)
-----   Message from Neena Rhymes sent at 06/22/2023  7:44 AM EST ----- Your Hep B surface antibody is reactive which means you have been vaccinated against Hep B.  Hep C is negative.

## 2023-06-22 NOTE — Telephone Encounter (Signed)
Pt has reviewed via MyChart

## 2023-06-30 ENCOUNTER — Ambulatory Visit: Payer: 59 | Admitting: Clinical

## 2023-08-06 ENCOUNTER — Ambulatory Visit: Payer: 59 | Admitting: Clinical

## 2023-08-11 ENCOUNTER — Ambulatory Visit (INDEPENDENT_AMBULATORY_CARE_PROVIDER_SITE_OTHER): Payer: 59 | Admitting: Clinical

## 2023-08-11 DIAGNOSIS — F419 Anxiety disorder, unspecified: Secondary | ICD-10-CM

## 2023-08-11 NOTE — Progress Notes (Signed)
 [facility] Counselor/Therapist Progress Note  Patient ID: Lauren Guerrero, MRN: [id number]    Date: 08/11/23  Time Spent: 12:59 pm - 2:01 pm: 62 Minutes  Type of Service Provided Individual Therapy  Type of Contact virtual (via Careg[employer] with real time audio and visual interaction)  Patient Location: home       Provider Location: office  Yetta MARLA Heinecke participated from home, via video, and consented to treatment. Therapist participated from office.  Patient consented to telehealth therapy and is aware and consented to the limitations of telehealth.   Mental Status Exam: Appearance:  Casual and Well Groomed     Behavior: Appropriate and Sharing  Motor: Normal  Speech/Language:  Clear and Coherent and Normal Rate  Affect: Appropriate  Mood: normal  Thought process: normal  Thought content:   WNL  Sensory/Perceptual disturbances:   WNL  Orientation: oriented to person, place, time/date, and situation  Attention: Good  Concentration: Good  Memory: WNL  Fund of knowledge:  Good  Insight:   Fair  Judgment:  Good  Impulse Control: Good   Risk Assessment: No apparent indicators of SI or HI during the visit  Presenting Problems, Reported Symptoms, and /or Interim History: Kristina presented for a session to address communication issues and the relationship with her spouse and anxiety.   Subjective: Yetta presented for an individual outpatient therapy session. The following was addressed during sessions.   Alphia rated her mood as a 9 out of 10 (with 1 being sad, 5 being neutral, and 10 being happy). Between visits she experienced elevated anxiety related to having a serious conversation with her husband about their relationship. Overall, she reported that the talk went well. Past hurts were discussed as well as ways to move forward. Cognitive distortions were reviewed along with how they could be playing into some of the communication and other challenges that  Lindsey is experiencing. Ways to reconnect with her spouse were discussed.   Interventions/Psychotherapy Techniques Used During Session: Cognitive Behavioral Therapy and Assertiveness/Communication  Diagnosis: Anxiety  MENTAL HEALTH INTERVENTIONS USED DURING TREATMENT & PATIENT'S RESPONSE TO INTERVENTIONS:  Short-term Objective addressed today:Contessa will recognize when anxiety is causing avoidance and continue to engage with (rather than avoid) reasonable anxiety-provoking situations. Mental health techniques used: Objective was addressed in session through the use of Cognitive Behavioral Therapy and discussion. Ronnie's response was positive. Progress Toward Goal: progressing   Short-term Objective addressed today:Wyolene will be able to implement more effective communication with her family members  Mental health techniques used: Objective was addressed in session through the use of Assertiveness/Communication and discussion. Emiya's response was positive. Progress Toward Goal: progressing    PLAN  1. Jakara will return for a therapy session.   2. Homework Given:  continue to monitor cognitive distortions, continue to practice communication with her spouse (including asking when she thinks that something may be wrong and taking his answer at face value unless concerns continue), work to find the fun in her relationship. This homework will be reviewed with Yetta at the next visit.  3. During the next session check in on mood and anxiety, and communication.     Keene Dumas, PhD  Individual Treatment Plan - please see the note from 10/22/2021 for complete treatment plan information and the note from 12/23/2022 for updated information.    New Goals as of 12/23/2022  Problem/Need: Anxiety - Julisa continues to experience anxiety at times, she has shown growth in this area but there is some ongoing  anxiety  Long-Term Goal #1: Maintain appropriate levels of anxiety and stress  level per client report   Short-Term Objectives: Objective 1A: Tommi will continue to monitor her mood and anxiety Objective 1B: Tiernan will be recognize when she is experiencing elevated anxiety or mood concerns and use her coping resources to better manage this Objective 1C: Brooke will recognize when anxiety is causing avoidance and continue to engage with (rather than avoid) reasonable anxiety-provoking situations.  Interventions: Cognitive Behavioral Therapy, Motivational Interviewing, and Psycho-education/Bibliotherapy  coping skills, and other evidenced-based practices will be used to promote progress towards healthy functioning and to help manage decrease symptoms associated with their diagnosis.  Treatment Regimen: Individual  skill building sessions every 2-3 weeks for addressing treatment goal/objective Target Date: 11/2023 Responsible Party: therapist and patient Person delivering treatment: Licensed Psychologist Keene Dumas, PhD will support the patient's ability to achieve the goals identified. Resolved: N/A new goal as of 12/23/2022   Problem/Need: communication challenge - Jaydy has had some challenges with communicating with her family members and others.  Long-Term Goal #2: Improve communication between [place] and her family members and other  Short-Term Objectives: Objective 2A: Aliha will be able to identify the goals of her communication with her family members. - Goal Met Objective 2B: Diandra will be able to identify breakdowns in communication - Goal Met Objective 2C: Rifka will be able to implement more effective communication with her family members  Objective 2D: Khadejah will expand her social circle and practice effective communication with these people  Interventions: Cognitive Behavioral Therapy, Assertiveness/Communication, and Motivational Interviewing , and other evidenced-based practices will be used to promote progress towards healthy  functioning and to help manage decrease symptoms associated with their diagnosis.  Treatment Regimen: Individual  skill building sessions every 2-3 weeks to address treatment goal/objective Target Date: 11/2023 Responsible Party: therapist and patient Person delivering treatment: Licensed Psychologist Keene Dumas, PhD will support the patient's ability to achieve the goals identified. Resolved: No - Troy has made strides with communication with some of her family members but there continue to be things to work on in her communication with others  Problem/Need: Grief - Latondra experienced the sudden loss of her father between the intake and initial therapy visit.  Long-Term Goal #3: Penne will be able to process her grief.  Resolved: Short-term objectives have been met, but as grief is not a linear process and grief and exploration of grief may continue to be a part of sessions in the future.  Therefore, the long-term goal will remain though there are no active short-term goals associated with this at this time.     Problem/Need: Life transition  Chelcie expressed a desire to become a more well-rounded person and to determine her identity outside of the role of parent and spouse   Long-Term Goal #4: Aviyah will explore avenues for personal growth.   Short-Term Objectives: Objective 4A: Berlie will explore activities that she would like to include in her daily, weekly, and/or monthly schedule  Objective 4B: Malayiah will begin further develop and explore her overall identity  Interventions: Cognitive Behavioral Therapy, Motivational Interviewing, and Psycho-education/Bibliotherapy  coping skills, and other evidenced-based practices will be used to promote progress towards healthy functioning and to help manage decrease symptoms associated with their diagnosis.  Treatment Regimen: Individual  skill building sessions every 2-3 weeks for addressing treatment goal/objective Target  Date: 11/2023 Responsible Party: therapist and patient Person delivering treatment: Licensed Psychologist Keene Dumas, PhD will support the patient's ability to  achieve the goals identified. Resolved: N/A new goal as of 12/23/2022   Keene Dumas, PhD

## 2023-08-18 NOTE — Therapy (Deleted)
OUTPATIENT PHYSICAL THERAPY UPPER EXTREMITY EVALUATION   Patient Name: Lauren Guerrero MRN: [id number] DOB:1976/06/15, 48 y.o., female Today's Date: 08/18/2023  END OF SESSION:   Past Medical History:  Diagnosis Date   Allergy    Anemia    Fibroid    Medical history non-contributory    Thyroid disease    Past Surgical History:  Procedure Laterality Date   NO PAST SURGERIES     thyroid nodules     WISDOM TOOTH EXTRACTION     Patient Active Problem List   Diagnosis Date Noted   Uterine leiomyoma 05/20/2022   Sickle cell trait (HCC) 05/20/2022   Breast lump 05/20/2022   Anxiety 03/22/2022   Trapezius muscle spasm 03/22/2022   Family history of colon cancer in father 11/14/2021   Vitamin D deficiency 06/16/2019   Multiple thyroid nodules 10/05/2017   Physical exam 10/05/2017   Peanut allergy 10/05/2017    PCP: Sheliah Hatch, MD   REFERRING PROVIDER: Sheliah Hatch, MD   REFERRING DIAG: [phone] (ICD-10-CM) - Left elbow pain  THERAPY DIAG:  No diagnosis found.  Rationale for Evaluation and Treatment: Rehabilitation  ONSET DATE: ***  SUBJECTIVE:                                                                                                                                                                                      SUBJECTIVE STATEMENT: *** Hand dominance: {MISC; OT HAND [phone]}  PERTINENT HISTORY: ***thyroid disease, anemia,   PAIN:  Are you having pain? Yes: NPRS scale: *** Pain location: *** Pain description: *** Aggravating factors: *** Relieving factors: ***  PRECAUTIONS: {Therapy precautions:24002}  RED FLAGS: {PT Red Flags:29287}   WEIGHT BEARING RESTRICTIONS: {Yes ***/No:24003}  FALLS:  Has patient fallen in last 6 months? {fallsyesno:27318}  LIVING ENVIRONMENT: Lives with: {OPRC lives with:25569::"lives with their family"} Lives in: {Lives in:25570} Stairs: {opstairs:27293} Has following equipment at  home: {Assistive devices:23999}  OCCUPATION: ***  PLOF: {PLOF:24004}  PATIENT GOALS:***  NEXT MD VISIT:   OBJECTIVE:  Note: Objective measures were completed at Evaluation unless otherwise noted.  DIAGNOSTIC FINDINGS:  ***  PATIENT SURVEYS:  {rehab surveys:24030:a}  COGNITION: Overall cognitive status: {cognition:24006}     SENSATION: {sensation:27233}  POSTURE: ***    UE Measurements Upper Extremity Right EVAL Left EVAL   A/PROM MMT A/PROM MMT  Shoulder Flexion      Shoulder Extension      Shoulder Abduction      Shoulder Adduction      Shoulder Internal Rotation      Shoulder External Rotation      Elbow Flexion      Elbow Extension  Wrist Flexion      Wrist Extension      Wrist Supination      Wrist Pronation      Wrist Ulnar Deviation      Wrist Radial Deviation      Grip Strength NA  NA     (Blank rows = not tested)   * pain   SHOULDER SPECIAL TESTS: Impingement tests: {shoulder impingement test:25231:a} SLAP lesions: {SLAP lesions:25232} Instability tests: {shoulder instability test:25233} Rotator cuff assessment: {rotator cuff assessment:25234} Biceps assessment: {biceps assessment:25235}  JOINT MOBILITY TESTING:  ***  PALPATION:  ***                                                                                                                             TREATMENT DATE: *** 08/18/2023  Therapeutic Exercise:  Aerobic: Supine: Prone:  Seated:  Standing: Neuromuscular Re-education: Manual Therapy: Therapeutic Activity: Self Care: Trigger Point Dry Needling:  Modalities:   PATIENT EDUCATION:  Education details: on current presentation, on HEP, on clinical outcomes score and POC Person educated: Patient Education method: Explanation, Demonstration, and Handouts Education comprehension: verbalized understanding   HOME EXERCISE PROGRAM: ***  ASSESSMENT:  CLINICAL IMPRESSION: Patient is a *** y.o. *** who was seen  today for physical therapy evaluation and treatment for ***.   OBJECTIVE IMPAIRMENTS: {opptimpairments:25111}.   ACTIVITY LIMITATIONS: {activitylimitations:27494}  PARTICIPATION LIMITATIONS: {participationrestrictions:25113}  PERSONAL FACTORS: {Personal factors:25162} are also affecting patient's functional outcome.   REHAB POTENTIAL: {rehabpotential:25112}  CLINICAL DECISION MAKING: {clinical decision making:25114}  EVALUATION COMPLEXITY: {Evaluation complexity:25115}   GOALS: Goals reviewed with patient? yes  SHORT TERM GOALS: Target date: {follow up:25551} *** MAKE TEXT EDITABLE Patient will be independent in self management strategies to improve quality of life and functional outcomes. Baseline: New Program Goal status: INITIAL  2.  Patient will report at least 50% improvement in overall symptoms and/or function to demonstrate improved functional mobility Baseline: 0% better Goal status: INITIAL  3.  *** Baseline:  Goal status: INITIAL  4.  *** Baseline:  Goal status: INITIAL    LONG TERM GOALS: Target date: {follow up:25551} *** MAKE TEXT EDITABLE  Patient will report at least 75% improvement in overall symptoms and/or function to demonstrate improved functional mobility Baseline: 0% better Goal status: INITIAL  2.  Patient will improve score on FOTO outcomes measure to projected score to demonstrate overall improved function and QOL Baseline: see above Goal status: INITIAL  3.  *** Baseline:  Goal status: INITIAL  4.  *** Baseline:  Goal status: INITIAL   PLAN:  PT FREQUENCY: {rehab frequency:25116}  PT DURATION: {rehab duration:25117}  PLANNED INTERVENTIONS: 97110-Therapeutic exercises, 97530- Therapeutic activity, 97112- Neuromuscular re-education, 97535- Self Care, 16109- Manual therapy, [id number]- Gait training, [phone]- Orthotic Fit/training, [phone]- Canalith repositioning, [id number]- Aquatic Therapy, 97014- Electrical stimulation (unattended), [phone]-  Ionotophoresis 4mg /ml Dexamethasone, Patient/Family education, Balance training, Stair training, Taping, Dry Needling, Joint mobilization, Joint manipulation, Spinal manipulation, Spinal mobilization, Cryotherapy, and Moist heat  PLAN FOR NEXT SESSION: ***   10:01 AM, 08/18/23 Tereasa Coop, DPT Physical Therapy with Time

## 2023-08-19 ENCOUNTER — Ambulatory Visit: Payer: 59 | Admitting: Physical Therapy

## 2023-09-08 ENCOUNTER — Other Ambulatory Visit (HOSPITAL_COMMUNITY): Payer: Self-pay | Admitting: *Deleted

## 2023-09-08 DIAGNOSIS — D573 Sickle-cell trait: Secondary | ICD-10-CM

## 2023-09-09 ENCOUNTER — Other Ambulatory Visit (HOSPITAL_BASED_OUTPATIENT_CLINIC_OR_DEPARTMENT_OTHER): Payer: 59

## 2023-09-09 ENCOUNTER — Ambulatory Visit: Payer: Managed Care, Other (non HMO) | Admitting: Podiatry

## 2023-09-09 ENCOUNTER — Encounter: Payer: Self-pay | Admitting: Podiatry

## 2023-09-09 DIAGNOSIS — S90852A Superficial foreign body, left foot, initial encounter: Secondary | ICD-10-CM

## 2023-09-09 NOTE — Progress Notes (Signed)
   Chief Complaint  Patient presents with   Foot Pain    RM#8 Splinter in left heel and spot on right foot feels like hard skin.    HPI: 48 y.o. female presenting today for new complaint of pain and tenderness associated to the plantar aspect of the left heel.  Patient states that they are getting new hardwood floors installed into their home and she stepped on a splinter.  She presents today as an urgent work in.  Past Medical History:  Diagnosis Date   Allergy    Anemia    Fibroid    Medical history non-contributory    Thyroid disease     Past Surgical History:  Procedure Laterality Date   NO PAST SURGERIES     thyroid nodules     WISDOM TOOTH EXTRACTION      Allergies  Allergen Reactions   Peanut-Containing Drug Products Anaphylaxis and Hives   Penicillins Hives    Did it involve swelling of the face/tongue/throat, SOB, or low BP? No Did it involve sudden or severe rash/hives, skin peeling, or any reaction on the inside of your mouth or nose? No Did you need to seek medical attention at a hospital or doctor's office? No When did it last happen?       If all above answers are "NO", may proceed with cephalosporin use.   Sulfa Antibiotics Hives     Physical Exam: General: The patient is alert and oriented x3 in no acute distress.  Dermatology: Skin is warm, dry and supple bilateral lower extremities.   Vascular: Palpable pedal pulses bilaterally. Capillary refill within normal limits.  No appreciable edema.  No erythema.  Neurological: Grossly intact via light touch  Musculoskeletal Exam: No pedal deformities noted.  Foreign body splinter noted embedded within the superficial layers of the left heel approximately 1.5 cm in length  Assessment/Plan of Care: 1.  Foreign body/wooden splinter left plantar heel; superficial  -Patient evaluated -Careful debridement of the superficial layers of the epidermis and dermis were performed today using a 312 scalpel without  incident or bleeding.  The foreign body splinter was identified and removed in toto.  Patient tolerated this well -Recommend triple antibiotic and a Band-Aid x 1-3 days -Advise against going barefoot -Return to clinic as needed       Felecia Shelling, DPM [facility]  Dr. Felecia Shelling, DPM    [address] [place], [place] [address]                Office [phone]  Fax [phone]

## 2023-09-10 ENCOUNTER — Ambulatory Visit (HOSPITAL_BASED_OUTPATIENT_CLINIC_OR_DEPARTMENT_OTHER)
Admission: RE | Admit: 2023-09-10 | Discharge: 2023-09-10 | Disposition: A | Discharge status: Home / Self Care | Payer: Self-pay | Source: Ambulatory Visit | Attending: Cardiology | Admitting: Cardiology

## 2023-09-10 DIAGNOSIS — D573 Sickle-cell trait: Secondary | ICD-10-CM | POA: Insufficient documentation

## 2023-09-11 ENCOUNTER — Encounter: Payer: Self-pay | Admitting: Cardiology

## 2023-09-29 ENCOUNTER — Ambulatory Visit: Payer: 59 | Admitting: Clinical

## 2023-10-02 ENCOUNTER — Other Ambulatory Visit: Payer: Self-pay | Admitting: Family Medicine

## 2023-10-02 DIAGNOSIS — Z1231 Encounter for screening mammogram for malignant neoplasm of breast: Secondary | ICD-10-CM

## 2023-10-26 ENCOUNTER — Other Ambulatory Visit (HOSPITAL_BASED_OUTPATIENT_CLINIC_OR_DEPARTMENT_OTHER): Payer: Self-pay | Admitting: Otolaryngology

## 2023-10-26 DIAGNOSIS — E041 Nontoxic single thyroid nodule: Secondary | ICD-10-CM

## 2023-11-02 ENCOUNTER — Ambulatory Visit (HOSPITAL_COMMUNITY)
Admission: RE | Admit: 2023-11-02 | Discharge: 2023-11-02 | Disposition: A | Discharge status: Home / Self Care | Source: Ambulatory Visit | Attending: Otolaryngology | Admitting: Otolaryngology

## 2023-11-02 DIAGNOSIS — E042 Nontoxic multinodular goiter: Secondary | ICD-10-CM | POA: Diagnosis not present

## 2023-11-02 DIAGNOSIS — E041 Nontoxic single thyroid nodule: Secondary | ICD-10-CM | POA: Insufficient documentation

## 2023-11-05 ENCOUNTER — Ambulatory Visit

## 2023-11-18 ENCOUNTER — Ambulatory Visit

## 2023-11-26 ENCOUNTER — Ambulatory Visit

## 2023-12-01 ENCOUNTER — Ambulatory Visit
Admission: RE | Admit: 2023-12-01 | Discharge: 2023-12-01 | Disposition: A | Discharge status: Home / Self Care | Source: Ambulatory Visit | Attending: Family Medicine | Admitting: Family Medicine

## 2023-12-01 DIAGNOSIS — Z1231 Encounter for screening mammogram for malignant neoplasm of breast: Secondary | ICD-10-CM | POA: Diagnosis not present

## 2023-12-10 ENCOUNTER — Telehealth: Payer: Self-pay | Admitting: Family Medicine

## 2023-12-10 NOTE — Telephone Encounter (Signed)
 Placed new referral for PT called patient and LM letting her know "referral has been changed and they should call in the next week to schedule initial appointment."

## 2023-12-10 NOTE — Telephone Encounter (Signed)
 Copied from CRM [id number]. Topic: Referral - Question >> Dec 09, 2023  1:23 PM Kita Perish H wrote: Reason for CRM: Patient would like to know if her referral for physical therapy for right knee can be sent to [facility] [place] instead of [facility] [phone]  [place] [phone]

## 2023-12-10 NOTE — Telephone Encounter (Signed)
 Ok for PT referral to switch to [facility]

## 2023-12-10 NOTE — Telephone Encounter (Signed)
 Please advise if this is okay to change

## 2023-12-23 ENCOUNTER — Telehealth: Payer: Self-pay

## 2023-12-23 DIAGNOSIS — M25561 Pain in right knee: Secondary | ICD-10-CM

## 2023-12-23 NOTE — Telephone Encounter (Signed)
 Copied from CRM [id number]. Topic: Referral - Question >> Dec 09, 2023  1:23 PM Kita Perish H wrote: Reason for CRM: Patient would like to know if her referral for physical therapy for right knee can be sent to [facility] at [facility] instead of [facility] [phone]  Joya [phone] >> Dec 23, 2023  2:48 PM Chuck Crater wrote: Patient stated that [facility] still doesn't have referral and she is calling to get an update on it.

## 2023-12-29 NOTE — Telephone Encounter (Signed)
 On further review we have discussed this referral a few times in a few messages but there is no referral sighed off in her chart, okay for me to place referral?

## 2023-12-29 NOTE — Telephone Encounter (Signed)
 I ok'd a PT referral to [facility] weeks ago for R knee pain.  Please enter referral if one has not been placed.  Thank you!

## 2023-12-29 NOTE — Telephone Encounter (Signed)
 I have ordered PT referral sorry for the delay thank you for your assistance Ethelle Herb

## 2023-12-29 NOTE — Addendum Note (Signed)
 Addended by: Khushboo Chuck K on: 12/29/2023 11:50 AM   Modules accepted: Orders

## 2024-01-21 ENCOUNTER — Ambulatory Visit (HOSPITAL_BASED_OUTPATIENT_CLINIC_OR_DEPARTMENT_OTHER): Payer: Self-pay | Admitting: Physical Therapy

## 2024-01-23 ENCOUNTER — Ambulatory Visit (HOSPITAL_BASED_OUTPATIENT_CLINIC_OR_DEPARTMENT_OTHER): Attending: Family Medicine | Admitting: Physical Therapy

## 2024-01-23 ENCOUNTER — Encounter (HOSPITAL_BASED_OUTPATIENT_CLINIC_OR_DEPARTMENT_OTHER): Payer: Self-pay | Admitting: Physical Therapy

## 2024-01-23 ENCOUNTER — Other Ambulatory Visit: Payer: Self-pay

## 2024-01-23 DIAGNOSIS — M25561 Pain in right knee: Secondary | ICD-10-CM | POA: Insufficient documentation

## 2024-01-23 DIAGNOSIS — M6281 Muscle weakness (generalized): Secondary | ICD-10-CM | POA: Insufficient documentation

## 2024-01-23 NOTE — Therapy (Signed)
 OUTPATIENT PHYSICAL THERAPY LOWER EXTREMITY EVALUATION   Patient Name: Lauren Guerrero MRN: [id number] DOB:Feb 15, 1976, 48 y.o., female Today's Date: 01/23/2024  END OF SESSION:  PT End of Session - 01/23/24 0857     Visit Number 1    Number of Visits 12    Date for PT Re-Evaluation 02/20/24    Authorization Type [employer]    PT Start Time 0757    PT Stop Time 0830    PT Time Calculation (min) 33 min    Activity Tolerance Patient tolerated treatment well    Behavior During Therapy [facility] for tasks assessed/performed          Past Medical History:  Diagnosis Date   Allergy    Anemia    Fibroid    Medical history non-contributory    Thyroid  disease    Past Surgical History:  Procedure Laterality Date   NO PAST SURGERIES     thyroid  nodules     WISDOM TOOTH EXTRACTION     Patient Active Problem List   Diagnosis Date Noted   Uterine leiomyoma 05/20/2022   Sickle cell trait (HCC) 05/20/2022   Breast lump 05/20/2022   Anxiety 03/22/2022   Trapezius muscle spasm 03/22/2022   Family history of colon cancer in father 11/14/2021   Vitamin D  deficiency 06/16/2019   Multiple thyroid  nodules 10/05/2017   Physical exam 10/05/2017   Peanut allergy 10/05/2017     REFERRING PROVIDER: Mahlon Comer BRAVO, MD  REFERRING DIAG:  Acute pain of right knee [M25.561]   THERAPY DIAG:  Acute pain of right knee  Muscle weakness (generalized)  Rationale for Evaluation and Treatment: Rehabilitation  ONSET DATE: December 2024  SUBJECTIVE:   SUBJECTIVE STATEMENT: Pt states that she has acute on chronic R knee pain. She reports having a bout of knee pain in the past but had PT which resolved. She noted a new onset of knee pain starting in December but does not recall a MOI. Ultrasound has been helpful in the past. She also notes that heat and ice have been beneficial. She is looking to get a strengthening program. 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    PERTINENT HISTORY: Anemia. PAIN:  Are you having pain? Yes: NPRS scale: 2/10 Pain location: Medial aspect of knee.  Pain description: Sharp and then dull ache.  Aggravating factors: Rotating of knee.  Relieving factors: Ice, heat, US , Strengthening.   PRECAUTIONS: None  RED FLAGS: None   WEIGHT BEARING RESTRICTIONS: No  FALLS:  Has patient fallen in last 6 months? No  LIVING ENVIRONMENT: Lives with: lives with their family Lives in: House/apartment Stairs: Yes: Internal: 12 steps; on right going up and External: 3 steps; none Has following equipment at home: None  OCCUPATION: Registered nurse,   PLOF: Independent  PATIENT GOALS: Pt would like to get stronger.   NEXT MD VISIT: None at this time.   OBJECTIVE:  Note: Objective measures were completed at Evaluation unless otherwise noted.  DIAGNOSTIC FINDINGS: None  PATIENT SURVEYS:  LEFS 71/80  COGNITION: Overall cognitive status: Within functional limits for tasks assessed     SENSATION: WFL  EDEMA:  NONE  POSTURE: No Significant postural limitations  PALPATION: Tenderness to medial tendons at knee.   LOWER EXTREMITY ROM:  Active ROM Right eval Left eval  Hip flexion [facility]  Hip internal rotation [facility]  Hip external rotation [facility]  [facility]nee flexion [facility]  Knee extension [facility] WFL   (  Blank rows = not tested)  LOWER EXTREMITY MMT: NT, pt would benefit from MMT with force production tool.  MMT Right eval Left eval  Hip flexion    Hip extension    Hip abduction    Hip adduction    Knee flexion    Knee extension     (Blank rows = not tested)  LOWER EXTREMITY SPECIAL TESTS:  Knee special tests: McMurray's test: negative  FUNCTIONAL TESTS:  5 times sit to stand: 14.65 seconds   GAIT: Distance walked: 45ft  Assistive device utilized: None Level of assistance: Complete Independence Comments: 60ft with no antalgic gait noted.                                                                                                                                  TREATMENT DATE: Creating, reviewing, and completing below HEP.    - Theracane to L shoulder - Side stepping with GTB  - Squats x10  - STM to lateral calf   PATIENT EDUCATION:  Education details: Educated pt on anatomy and physiology of current symptoms, LEFS, diagnosis, prognosis, HEP,  and POC. Person educated: Patient Education method: Explanation, Demonstration, Tactile cues, Verbal cues, and Handouts Education comprehension: verbalized understanding and returned demonstration  HOME EXERCISE PROGRAM: Access Code: [email or web address] URL: [email or web address] Date: 01/23/2024 Prepared by: Rojean Batten  Exercises - Side Stepping with Resistance at Ankles  - 1-2 x daily - 7 x weekly - 2-3 sets - 10 reps - Gastroc Stretch with Foot at Wall  - 1-2 x daily - 7 x weekly - 2-3 sets - 3 reps - 1 min hold - Squat with Resistance at Thighs  - 1-2 x daily - 7 x weekly - 2-3 sets - 10 reps   ASSESSMENT:  CLINICAL IMPRESSION: Patient referred to PT for R knee pain. Pt has some tenderness to medial side of knee. She denies any MOI, but reports doing a lot of repetitive movements. Pt is unaware of any particular movement that causes pain, other than rotation. Encouraged pt to be mindful of movement to ensure that she is not causing overuse. She has full functional ROM of bilat LE's. Would benefit from force production MMT rather than grading due to current strength. Patient will benefit from skilled PT to address below impairments, limitations and improve overall function.  OBJECTIVE IMPAIRMENTS: decreased activity tolerance, difficulty walking, decreased balance, decreased endurance, decreased mobility, decreased ROM, decreased strength, impaired flexibility, impaired UE/LE use, postural dysfunction, and pain.  ACTIVITY LIMITATIONS: bending, lifting, carry, locomotion, cleaning, community activity, driving, and or  occupation  PERSONAL FACTORS: Aneima are also affecting patient's functional outcome.  REHAB POTENTIAL: Excellent  CLINICAL DECISION MAKING: Stable/uncomplicated  EVALUATION COMPLEXITY: Low    GOALS: Short term PT Goals Target date: 02/20/2024 Pt will be I and compliant with HEP. Baseline:  Goal status: New Pt will decrease pain by 25% overall Baseline: Goal status: New  Long term PT  goals Target date: 03/26/2024 Pt will improve hip/knee strength by  TINDEQ MMT to improve functional strength Baseline: Goal status: New Pt will improve LEFS by 9 points to show MCID.  Baseline: 71/80 Goal status: New Pt will reduce worst pain by overall 50% overall with usual activity Baseline: 9/10 Goal status: New Pt will reduce pain to overall less than 2-3/10 with usual recreational and work activity. Baseline: Goal status: New Pt will be able to perform rotational movements without noted 9/10 pain.  Baseline: Goal status: New  PLAN: PT FREQUENCY: 1-2 times per week   PT DURATION: 6-10 weeks  PLANNED INTERVENTIONS (unless contraindicated): aquatic PT, Canalith repositioning, cryotherapy, Electrical stimulation, Iontophoresis with 4 mg/ml dexamethasome, Moist heat, traction, Ultrasound, gait training, Therapeutic exercise, balance training, neuromuscular re-education, patient/family education, prosthetic training, manual techniques, passive ROM, dry needling, taping, vasopnuematic device, vestibular, spinal manipulations, joint manipulations  PLAN FOR NEXT SESSION: Assess HEP/update PRN, continue to progress functional mobility, strengthen LE muscles. Decrease patients pain. Assess strength via TI     Rojean JONELLE Batten, PT 01/23/2024, 12:12 PM

## 2024-01-25 ENCOUNTER — Ambulatory Visit (HOSPITAL_BASED_OUTPATIENT_CLINIC_OR_DEPARTMENT_OTHER): Admitting: Physical Therapy

## 2024-01-25 DIAGNOSIS — M6281 Muscle weakness (generalized): Secondary | ICD-10-CM | POA: Diagnosis not present

## 2024-01-25 DIAGNOSIS — M25561 Pain in right knee: Secondary | ICD-10-CM

## 2024-01-25 NOTE — Therapy (Signed)
 OUTPATIENT PHYSICAL THERAPY LOWER EXTREMITY TREATMENT   Patient Name: Lauren Guerrero MRN: [id number] DOB:1976-01-16, 48 y.o., female Today's Date: 01/25/2024  END OF SESSION:  PT End of Session - 01/25/24 1112     Visit Number 2    Number of Visits 12    Date for PT Re-Evaluation 02/20/24    Authorization Type [employer]    PT Start Time 1111   pt arrived late   PT Stop Time 1145    PT Time Calculation (min) 34 min    Activity Tolerance Patient tolerated treatment well    Behavior During Therapy [facility] for tasks assessed/performed          Past Medical History:  Diagnosis Date   Allergy    Anemia    Fibroid    Medical history non-contributory    Thyroid  disease    Past Surgical History:  Procedure Laterality Date   NO PAST SURGERIES     thyroid  nodules     WISDOM TOOTH EXTRACTION     Patient Active Problem List   Diagnosis Date Noted   Uterine leiomyoma 05/20/2022   Sickle cell trait (HCC) 05/20/2022   Breast lump 05/20/2022   Anxiety 03/22/2022   Trapezius muscle spasm 03/22/2022   Family history of colon cancer in father 11/14/2021   Vitamin D  deficiency 06/16/2019   Multiple thyroid  nodules 10/05/2017   Physical exam 10/05/2017   Peanut allergy 10/05/2017     REFERRING PROVIDER: Mahlon Comer BRAVO, MD  REFERRING DIAG:  Acute pain of right knee [M25.561]   THERAPY DIAG:  Muscle weakness (generalized)  Acute pain of right knee  Rationale for Evaluation and Treatment: Rehabilitation  ONSET DATE: December 2024  SUBJECTIVE:   SUBJECTIVE STATEMENT: Pt reports no new changes since evaluation.  From initial evaluation:  Pt states that she has acute on chronic R knee pain. She reports having a bout of knee pain in the past but had PT which resolved. She noted a new onset of knee pain starting in December but does not recall a MOI. Ultrasound has been helpful in the past. She also notes that heat and ice have been beneficial. She is looking to  get a strengthening program. 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   PERTINENT HISTORY: Anemia. PAIN:  Are you having pain? Yes: NPRS scale: 3/10 R knee, 9/10 R posterior ankle  Pain location: Medial aspect of knee.  Pain description: Sharp and then dull ache.  Aggravating factors: Rotating of knee.  Relieving factors: Ice, heat, US , Strengthening.   PRECAUTIONS: None  RED FLAGS: None   WEIGHT BEARING RESTRICTIONS: No  FALLS:  Has patient fallen in last 6 months? No  LIVING ENVIRONMENT: Lives with: lives with their family Lives in: House/apartment Stairs: Yes: Internal: 12 steps; on right going up and External: 3 steps; none Has following equipment at home: None  OCCUPATION: Registered nurse,   PLOF: Independent  PATIENT GOALS: Pt would like to get stronger.   NEXT MD VISIT: None at this time.   OBJECTIVE:  Note: Objective measures were completed at Evaluation unless otherwise noted.  DIAGNOSTIC FINDINGS: None  PATIENT SURVEYS:  LEFS 71/80  COGNITION: Overall cognitive status: Within functional limits for tasks assessed     SENSATION: WFL  EDEMA:  NONE  POSTURE: No Significant postural limitations  PALPATION: Tenderness to medial tendons at knee.   LOWER EXTREMITY ROM:  Active ROM Right eval Left eval  Hip flexion [facility]  Hip internal rotation [facility]  Hip external rotation [facility]  Knee flexion [facility] WFL  Knee extension WFL WFL   (Blank rows = not tested)  LOWER EXTREMITY MMT: NT, pt would benefit from MMT with force production tool.  MMT Right eval Left eval  Hip flexion    Hip extension    Hip abduction    Hip adduction    Knee flexion    Knee extension     (Blank rows = not tested)  LOWER EXTREMITY SPECIAL TESTS:  Knee special tests: McMurray's test: negative  FUNCTIONAL TESTS:  5 times sit to stand: 14.65 seconds   GAIT: Distance walked: 10ft  Assistive device utilized: None Level of assistance:  Complete Independence Comments: 4ft with no antalgic gait noted.                                                                                                                                 TREATMENT DATE:  Self care: pt instructed in self massage with roller stick to R quad and calf Therapeutic exercise:   - gastroc stretch 15s x 2 each LE; soleus stretch 15s x 2 each - squat with forward arm reach x 5 -> buttocks to chair, with 4 sec lowering x 10 with band above knees - side stepping with green band at thighs in squat - x band side stepping with green band 77ft R/L x 2 - R/L Hamstring stretch - seated and standing 15s each - R/L adductor stretch - standing 15s each - sensitive skin rock tape applied to R medial knee with 20% stretch to increase proprioception and decompress tissue  PATIENT EDUCATION:  Education details: Educated pt on anatomy and physiology of current symptoms, LEFS, diagnosis, prognosis, HEP,  and POC. Person educated: Patient Education method: Explanation, Demonstration, Tactile cues, Verbal cues, and Handouts Education comprehension: verbalized understanding and returned demonstration  HOME EXERCISE PROGRAM: Access Code: [email or web address] URL: [email or web address]   ASSESSMENT:  CLINICAL IMPRESSION:  Pt tolerated session well, without increase in pain. Pt inquired about evaluation of R shoulder and was advised to get referral for shoulder if interested in seeking treatment.  HEP updated.  Goals are ongoing.    From initial evaluation:  Patient referred to PT for R knee pain. Pt has some tenderness to medial side of knee. She denies any MOI, but reports doing a lot of repetitive movements. Pt is unaware of any particular movement that causes pain, other than rotation. Encouraged pt to be mindful of movement to ensure that she is not causing overuse. She has full functional ROM of bilat LE's. Would benefit from force production MMT rather than grading  due to current strength. Patient will benefit from skilled PT to address below impairments, limitations and improve overall function.  OBJECTIVE IMPAIRMENTS: decreased activity tolerance, difficulty walking, decreased balance, decreased endurance, decreased mobility, decreased ROM, decreased strength, impaired flexibility, impaired UE/LE use, postural dysfunction, and pain.  ACTIVITY  LIMITATIONS: bending, lifting, carry, locomotion, cleaning, community activity, driving, and or occupation  PERSONAL FACTORS: Aneima are also affecting patient's functional outcome.  REHAB POTENTIAL: Excellent  CLINICAL DECISION MAKING: Stable/uncomplicated  EVALUATION COMPLEXITY: Low    GOALS: Short term PT Goals Target date: 02/20/2024 Pt will be I and compliant with HEP. Baseline:  Goal status: New Pt will decrease pain by 25% overall Baseline: Goal status: New  Long term PT goals Target date: 03/26/2024 Pt will improve hip/knee strength by  TINDEQ MMT to improve functional strength Baseline: Goal status: New Pt will improve LEFS by 9 points to show MCID.  Baseline: 71/80 Goal status: New Pt will reduce worst pain by overall 50% overall with usual activity Baseline: 9/10 Goal status: New Pt will reduce pain to overall less than 2-3/10 with usual recreational and work activity. Baseline: Goal status: New Pt will be able to perform rotational movements without noted 9/10 pain.  Baseline: Goal status: New  PLAN: PT FREQUENCY: 1-2 times per week   PT DURATION: 6-10 weeks  PLANNED INTERVENTIONS (unless contraindicated): aquatic PT, Canalith repositioning, cryotherapy, Electrical stimulation, Iontophoresis with 4 mg/ml dexamethasome, Moist heat, traction, Ultrasound, gait training, Therapeutic exercise, balance training, neuromuscular re-education, patient/family education, prosthetic training, manual techniques, passive ROM, dry needling, taping, vasopnuematic device, vestibular, spinal  manipulations, joint manipulations  PLAN FOR NEXT SESSION: Assess HEP/update PRN, continue to progress functional mobility, strengthen LE muscles. Decrease patients pain. Assess strength via TI   Delon Aquas, PTA 01/25/24 12:05 PM [facility] [address] [place], [place], [address] Phone: [phone]   Fax:  [phone]

## 2024-02-11 NOTE — Therapy (Signed)
 OUTPATIENT PHYSICAL THERAPY LOWER EXTREMITY TREATMENT   Patient Name: Lauren Guerrero MRN: [id number] DOB:September 15, 1975, 48 y.o., female Today's Date: 02/12/2024  END OF SESSION:  PT End of Session - 02/12/24 1028     Visit Number 3    Number of Visits 12    Date for PT Re-Evaluation 02/20/24    Authorization Type [employer]    PT Start Time 0934    PT Stop Time 1019    PT Time Calculation (min) 45 min    Activity Tolerance Patient tolerated treatment well    Behavior During Therapy [facility] for tasks assessed/performed           Past Medical History:  Diagnosis Date   Allergy    Anemia    Fibroid    Medical history non-contributory    Thyroid  disease    Past Surgical History:  Procedure Laterality Date   NO PAST SURGERIES     thyroid  nodules     WISDOM TOOTH EXTRACTION     Patient Active Problem List   Diagnosis Date Noted   Uterine leiomyoma 05/20/2022   Sickle cell trait (HCC) 05/20/2022   Breast lump 05/20/2022   Anxiety 03/22/2022   Trapezius muscle spasm 03/22/2022   Family history of colon cancer in father 11/14/2021   Vitamin D  deficiency 06/16/2019   Multiple thyroid  nodules 10/05/2017   Physical exam 10/05/2017   Peanut allergy 10/05/2017     REFERRING PROVIDER: Mahlon Comer BRAVO, MD  REFERRING DIAG:  Acute pain of right knee [M25.561]   THERAPY DIAG:  Acute pain of right knee  Muscle weakness (generalized)  Rationale for Evaluation and Treatment: Rehabilitation  ONSET DATE: December 2024  SUBJECTIVE:   SUBJECTIVE STATEMENT: Pt states she feels about the same.  She denies any adverse effects after Rx. Pt reports having some pain in medial ankle/achilles with performing stairs.  Pt reports compliance with HEP.  Pt states US  has helped her knee in the past.      PERTINENT HISTORY: Anemia. PAIN:  Are you having pain? Yes: NPRS scale: 2/10 R knee Pain location: Medial aspect of knee.  Pain description: Sharp and then dull ache.   Aggravating factors: Rotating of knee.  Relieving factors: Ice, heat, US , Strengthening.   PRECAUTIONS: None  RED FLAGS: None   WEIGHT BEARING RESTRICTIONS: No  FALLS:  Has patient fallen in last 6 months? No  LIVING ENVIRONMENT: Lives with: lives with their family Lives in: House/apartment Stairs: Yes: Internal: 12 steps; on right going up and External: 3 steps; none Has following equipment at home: None  OCCUPATION: Registered nurse,   PLOF: Independent  PATIENT GOALS: Pt would like to get stronger.   NEXT MD VISIT: None at this time.   OBJECTIVE:  Note: Objective measures were completed at Evaluation unless otherwise noted.  DIAGNOSTIC FINDINGS: None  PATIENT SURVEYS:  LEFS 71/80  COGNITION: Overall cognitive status: Within functional limits for tasks assessed     SENSATION: WFL  EDEMA:  NONE  POSTURE: No Significant postural limitations  PALPATION: Tenderness to medial tendons at knee.   LOWER EXTREMITY ROM:  Active ROM Right eval Left eval  Hip flexion [name]  Hip internal rotation [facility]  Hip external rotation [facility]  Knee flexion [facility] WFL  Knee extension WFL WFL   (Blank rows = not tested)  LOWER EXTREMITY MMT: NT, pt would benefit from MMT with force production tool.  MMT Right eval Left eval Right 7/18 Left 7/18  Hip flexion   5/5 ; 41.7 5/5 ; 48.3  Hip extension   5/5 4/5  Hip abduction   5/5 ; Seated:36.6. S/L: 33.3  5/5; Seated:  36.8.  S/L:  37.7  Hip adduction      Knee flexion   4+/5 5/5  Knee extension   5/5 ; 44.2 5/5 ; 43.2   (Blank rows = not tested)  LOWER EXTREMITY SPECIAL TESTS:  Knee special tests: McMurray's test: negative  FUNCTIONAL TESTS:  5 times sit to stand: 14.65 seconds   GAIT: Distance walked: 48ft  Assistive device utilized: None Level of assistance: Complete Independence Comments: 21ft with no antalgic gait noted.                                                                                                                                  TREATMENT DATE:  02/12/24:  Reviewed pain level, HEP compliance, and response to prior Rx.  PT assessed strength.  See above.  Pt performed: Supine bridge x 10, SL bridge (figure 4 position)  x10 each Supine SLR 2x10 Lateral band walks with GTB at thighs 2x10, at ankle x 10  Modalities: Pt received US  to medial knee at 3.3 MHz and proximal medial calf and distal medial thigh at 1.0 MHz, 50% pulsed, and 1.0 W/cm2 x 10 mins to decrease pain and inflammation.   PATIENT EDUCATION:  Education details:  strength findings, exercise form, rationale of interventions, diagnosis, prognosis, HEP,  and POC. Person educated: Patient Education method: Explanation, Demonstration, Tactile cues, Verbal cues, and Handouts Education comprehension: verbalized understanding and returned demonstration  HOME EXERCISE PROGRAM: Access Code: [email or web address] URL: [email or web address]   ASSESSMENT:  CLINICAL IMPRESSION:   PT assessed strength by HHD and MMT.  Pt was weaker in R hip flexion compred to L hip flexion per HHD testing.  She was weaker with R hip abd compared to L in S/L with HHD though approx the same seated.  Pt had weakness in L hip extension and minimal weakness in R HS.  Pt performed exercises well with cuing and instruction in correct form.  Pt tolerated treatment well having no c/o's.  Pt reports that US  has helped her knee in the past and PT used US  to medial knee, distal thigh, and proximal calf.  Pt responded well to Rx reporting improved pain from 2/10 before Rx to 0/10 after Rx.  Pt should benefit from cont skilled PT to address goals and improve pain, sx's, strength, and function.   OBJECTIVE IMPAIRMENTS: decreased activity tolerance, difficulty walking, decreased balance, decreased endurance, decreased mobility, decreased ROM, decreased strength, impaired flexibility, impaired UE/LE use, postural dysfunction, and pain.  ACTIVITY  LIMITATIONS: bending, lifting, carry, locomotion, cleaning, community activity, driving, and or occupation  PERSONAL FACTORS: Aneima are also affecting patient's functional outcome.  REHAB POTENTIAL: Excellent  CLINICAL DECISION MAKING: Stable/uncomplicated  EVALUATION COMPLEXITY: Low    GOALS: Short term PT Goals Target date:  02/20/2024 Pt will be I and compliant with HEP. Baseline:  Goal status: New Pt will decrease pain by 25% overall Baseline: Goal status: New  Long term PT goals Target date: 03/26/2024 Pt will improve hip/knee strength by  TINDEQ MMT to improve functional strength Baseline: Goal status: New Pt will improve LEFS by 9 points to show MCID.  Baseline: 71/80 Goal status: New Pt will reduce worst pain by overall 50% overall with usual activity Baseline: 9/10 Goal status: New Pt will reduce pain to overall less than 2-3/10 with usual recreational and work activity. Baseline: Goal status: New Pt will be able to perform rotational movements without noted 9/10 pain.  Baseline: Goal status: New  PLAN: PT FREQUENCY: 1-2 times per week   PT DURATION: 6-10 weeks  PLANNED INTERVENTIONS (unless contraindicated): aquatic PT, Canalith repositioning, cryotherapy, Electrical stimulation, Iontophoresis with 4 mg/ml dexamethasome, Moist heat, traction, Ultrasound, gait training, Therapeutic exercise, balance training, neuromuscular re-education, patient/family education, prosthetic training, manual techniques, passive ROM, dry needling, taping, vasopnuematic device, vestibular, spinal manipulations, joint manipulations  PLAN FOR NEXT SESSION: Assess HEP/update PRN, continue to progress functional mobility, strengthen LE muscles. Decrease patients pain.    Leigh Minerva III PT, DPT 02/12/24 4:18 PM  [facility] [address] [place], [place], [address] Phone: [phone]   Fax:  [phone]

## 2024-02-12 ENCOUNTER — Ambulatory Visit (HOSPITAL_BASED_OUTPATIENT_CLINIC_OR_DEPARTMENT_OTHER): Attending: Family Medicine | Admitting: Physical Therapy

## 2024-02-12 ENCOUNTER — Encounter (HOSPITAL_BASED_OUTPATIENT_CLINIC_OR_DEPARTMENT_OTHER): Payer: Self-pay | Admitting: Physical Therapy

## 2024-02-12 DIAGNOSIS — M25561 Pain in right knee: Secondary | ICD-10-CM | POA: Diagnosis not present

## 2024-02-12 DIAGNOSIS — M6281 Muscle weakness (generalized): Secondary | ICD-10-CM | POA: Diagnosis not present

## 2024-02-12 DIAGNOSIS — M25511 Pain in right shoulder: Secondary | ICD-10-CM | POA: Diagnosis not present

## 2024-02-13 ENCOUNTER — Encounter (HOSPITAL_BASED_OUTPATIENT_CLINIC_OR_DEPARTMENT_OTHER): Admitting: Physical Therapy

## 2024-02-16 ENCOUNTER — Encounter (HOSPITAL_BASED_OUTPATIENT_CLINIC_OR_DEPARTMENT_OTHER): Payer: Self-pay | Admitting: Rehabilitative and Restorative Service Providers"

## 2024-02-16 ENCOUNTER — Telehealth: Payer: Self-pay | Admitting: Family Medicine

## 2024-02-16 ENCOUNTER — Ambulatory Visit (HOSPITAL_BASED_OUTPATIENT_CLINIC_OR_DEPARTMENT_OTHER): Payer: Self-pay | Admitting: Rehabilitative and Restorative Service Providers"

## 2024-02-16 DIAGNOSIS — M25511 Pain in right shoulder: Secondary | ICD-10-CM | POA: Diagnosis not present

## 2024-02-16 DIAGNOSIS — M6281 Muscle weakness (generalized): Secondary | ICD-10-CM

## 2024-02-16 DIAGNOSIS — M25561 Pain in right knee: Secondary | ICD-10-CM | POA: Diagnosis not present

## 2024-02-16 NOTE — Telephone Encounter (Signed)
 Copied from CRM [id number]. Topic: Referral - Request for Referral >> Feb 16, 2024  1:25 PM Robinson H wrote:   Did the patient discuss referral with their provider in the last year? Yes (If No - schedule appointment) (If Yes - send message)  Appointment offered? No  Type of order/referral and detailed reason for visit: Physical Therapy for right shoulder  Preference of office, provider, location:  [facility], patient already going to location for physical therapy on knee, states previous referral was in for shoulder but sent to a different location  If referral order, have you been seen by this specialty before? Yes (If Yes, this issue or another issue? When? Where?  Can we respond through MyChart? Yes

## 2024-02-16 NOTE — Telephone Encounter (Signed)
 Please see messages below about referral, thank you!

## 2024-02-16 NOTE — Telephone Encounter (Signed)
 Patient is requesting a referral for right shoulder. Last referral for PT was made for right knee by Dr. Mahlon.

## 2024-02-16 NOTE — Therapy (Addendum)
 OUTPATIENT PHYSICAL THERAPY LOWER EXTREMITY TREATMENT   Patient Name: Lauren Guerrero MRN: [id number] DOB:Dec 02, 1975, 48 y.o., female Today's Date: 02/16/2024  END OF SESSION:  PT End of Session - 02/16/24 0950     Visit Number 4    Number of Visits 12    Date for PT Re-Evaluation 02/20/24    Authorization Type [employer]    PT Start Time 0940    PT Stop Time 1020    PT Time Calculation (min) 40 min    Activity Tolerance Patient tolerated treatment well;No increased pain    Behavior During Therapy [facility] for tasks assessed/performed            Past Medical History:  Diagnosis Date   Allergy    Anemia    Fibroid    Medical history non-contributory    Thyroid  disease    Past Surgical History:  Procedure Laterality Date   NO PAST SURGERIES     thyroid  nodules     WISDOM TOOTH EXTRACTION     Patient Active Problem List   Diagnosis Date Noted   Uterine leiomyoma 05/20/2022   Sickle cell trait (HCC) 05/20/2022   Breast lump 05/20/2022   Anxiety 03/22/2022   Trapezius muscle spasm 03/22/2022   Family history of colon cancer in father 11/14/2021   Vitamin D  deficiency 06/16/2019   Multiple thyroid  nodules 10/05/2017   Physical exam 10/05/2017   Peanut allergy 10/05/2017     REFERRING PROVIDER: Mahlon Comer BRAVO, MD  REFERRING DIAG:  Acute pain of right knee [M25.561]   THERAPY DIAG:  Acute pain of right knee  Muscle weakness (generalized)  Rationale for Evaluation and Treatment: Rehabilitation  ONSET DATE: December 2024  SUBJECTIVE:   SUBJECTIVE STATEMENT: Pain over the weekend was like 2-3 going up/down stairs. Pt 10' late.     PERTINENT HISTORY: Anemia. PAIN:  Are you having pain? Yes: NPRS scale: 2/10 R knee Pain location: Medial aspect of knee.  Pain description: Sharp and then dull ache.  Aggravating factors: Rotating of knee.  Relieving factors: Ice, heat, US , Strengthening.   PRECAUTIONS: None  RED FLAGS: None   WEIGHT  BEARING RESTRICTIONS: No  FALLS:  Has patient fallen in last 6 months? No  LIVING ENVIRONMENT: Lives with: lives with their family Lives in: House/apartment Stairs: Yes: Internal: 12 steps; on right going up and External: 3 steps; none Has following equipment at home: None  OCCUPATION: Registered nurse,   PLOF: Independent  PATIENT GOALS: Pt would like to get stronger.   NEXT MD VISIT: None at this time.   OBJECTIVE:  Note: Objective measures were completed at Evaluation unless otherwise noted.  DIAGNOSTIC FINDINGS: None  PATIENT SURVEYS:  LEFS 71/80  COGNITION: Overall cognitive status: Within functional limits for tasks assessed     SENSATION: WFL  EDEMA:  NONE  POSTURE: No Significant postural limitations  PALPATION: Tenderness to medial tendons at knee.   LOWER EXTREMITY ROM:  Active ROM Right eval Left eval  Hip flexion [facility]  Hip internal rotation [facility]  Hip external rotation [facility]  Knee flexion [facility] WFL  Knee extension WFL WFL   (Blank rows = not tested)  LOWER EXTREMITY MMT: NT, pt would benefit from MMT with force production tool.  MMT Right eval Left eval Right 7/18 Left 7/18  Hip flexion   5/5 ; 41.7 5/5 ; 48.3  Hip extension   5/5 4/5  Hip abduction   5/5 ; Seated:36.6. S/L: 33.3  5/5; Seated:  36.8.  S/L:  37.7  Hip adduction      Knee flexion   4+/5 5/5  Knee extension   5/5 ; 44.2 5/5 ; 43.2   (Blank rows = not tested)  LOWER EXTREMITY SPECIAL TESTS:  Knee special tests: McMurray's test: negative  FUNCTIONAL TESTS:  5 times sit to stand: 14.65 seconds   GAIT: Distance walked: 5ft  Assistive device utilized: None Level of assistance: Complete Independence Comments: 9ft with no antalgic gait noted.                                                                                                                                 TREATMENT DATE:   02/16/24: Supine SLR Rx20 with elevator approach with 2 stops each  direction Supine SLR/hip abduction combo x 20 Supine SLR/hip circle CW x 20 Bridge x 20 with glute set Bridge with ball squeeze x 20 Iso Bridge with ball squeeze with LAQ unilat 2x10. Performed bil. Sidelying R hip abduction with foot in neutral and then ER x 20 each Lateral squat side steps x 3 bouts along  Airex foam R LE with L hip abdct x 20, L hamstring curl x 20, squat 2x10 Double toe raise/heel raise x 20 each New HEP issued to include SLR, SLR with hip ER, and bridge with ball squeeze     02/12/24:  Reviewed pain level, HEP compliance, and response to prior Rx.  PT assessed strength.  See above.  Pt performed: Supine bridge x 10, SL bridge (figure 4 position)  x10 each Supine SLR 2x10 Lateral band walks with GTB at thighs 2x10, at ankle x 10  Modalities: Pt received US  to medial knee at 3.3 MHz and proximal medial calf and distal medial thigh at 1.0 MHz, 50% pulsed, and 1.0 W/cm2 x 10 mins to decrease pain and inflammation.   PATIENT EDUCATION:  Education details:  strength findings, exercise form, rationale of interventions, diagnosis, prognosis, HEP,  and POC. Person educated: Patient Education method: Explanation, Demonstration, Tactile cues, Verbal cues, and Handouts Education comprehension: verbalized understanding and returned demonstration  HOME EXERCISE PROGRAM: Access Code: [email or web address] URL: [email or web address] Date: 02/16/2024 Prepared by: Alger Ada  Exercises - Side Stepping with Resistance at Ankles  - 1 x daily - 7 x weekly - 1-2 sets - 10 reps - X Band Walk  - 1 x daily - 7 x weekly - 1-2 sets - 10 reps - Squat with Resistance at Thighs  - 1 x daily - 7 x weekly - 2-3 sets - 10 reps - Gastroc Stretch with Foot at Wall  - 1-2 x daily - 7 x weekly - 3 reps - 1 min hold - Soleus Stretch on Wall  - 1-2 x daily - 7 x weekly - 2-3 reps - 15 hold - Standing Hamstring Stretch With Foot on Stairs in Pool  - 2 x daily - 7 x weekly - 2  reps - 20  seconds  hold - Standing Quadriceps Stretch  - 1 x daily - 7 x weekly - 2 reps - 20 seconds  hold - Straight Leg Raise with External Rotation  - 1 x daily - 7 x weekly - 1 sets - 10 reps - Supine Bridge with Mini Swiss Ball Between Knees  - 1 x daily - 7 x weekly - 1 sets - 10 reps - Small Range Straight Leg Raise  - 1 x daily - 7 x weekly - 1 sets - 10 reps   ASSESSMENT:  CLINICAL IMPRESSION:    Pt tolerated treatment well having no c/o's.  Pt did well with exercise progression to focus on increasing R knee/hip strength. Pt should benefit from cont skilled PT to address goals and improve pain, sx's, strength, and function.   OBJECTIVE IMPAIRMENTS: decreased activity tolerance, difficulty walking, decreased balance, decreased endurance, decreased mobility, decreased ROM, decreased strength, impaired flexibility, impaired UE/LE use, postural dysfunction, and pain.  ACTIVITY LIMITATIONS: bending, lifting, carry, locomotion, cleaning, community activity, driving, and or occupation  PERSONAL FACTORS: Aneima are also affecting patient's functional outcome.  REHAB POTENTIAL: Excellent  CLINICAL DECISION MAKING: Stable/uncomplicated  EVALUATION COMPLEXITY: Low    GOALS: Short term PT Goals Target date: 02/20/2024 Pt will be I and compliant with HEP. Baseline:  Goal status: New Pt will decrease pain by 25% overall Baseline: Goal status: New  Long term PT goals Target date: 03/26/2024 Pt will improve hip/knee strength by  TINDEQ MMT to improve functional strength Baseline: Goal status: New Pt will improve LEFS by 9 points to show MCID.  Baseline: 71/80 Goal status: New Pt will reduce worst pain by overall 50% overall with usual activity Baseline: 9/10 Goal status: New Pt will reduce pain to overall less than 2-3/10 with usual recreational and work activity. Baseline: Goal status: New Pt will be able to perform rotational movements without noted 9/10 pain.  Baseline: Goal  status: New  PLAN: PT FREQUENCY: 1-2 times per week   PT DURATION: 6-10 weeks  PLANNED INTERVENTIONS (unless contraindicated): aquatic PT, Canalith repositioning, cryotherapy, Electrical stimulation, Iontophoresis with 4 mg/ml dexamethasome, Moist heat, traction, Ultrasound, gait training, Therapeutic exercise, balance training, neuromuscular re-education, patient/family education, prosthetic training, manual techniques, passive ROM, dry needling, taping, vasopnuematic device, vestibular, spinal manipulations, joint manipulations  PLAN FOR NEXT SESSION:  continue to progress functional mobility, strengthen LE muscles. Decrease patients pain. US ? Review new HEP issued 02/16/24     [facility] [address] [place], [place], [address] Phone: [phone]   Fax:  [phone]

## 2024-02-17 NOTE — Telephone Encounter (Signed)
 Referral team needs a new referral for patient. Please see messages below. Thank you!

## 2024-02-17 NOTE — Telephone Encounter (Signed)
 Placed a new order for PT for patients right shoulder to [facility].

## 2024-02-17 NOTE — Addendum Note (Signed)
 Addended by: Adlean Hardeman A on: 02/17/2024 12:13 PM   Modules accepted: Orders

## 2024-02-18 NOTE — Telephone Encounter (Signed)
 error

## 2024-02-25 ENCOUNTER — Ambulatory Visit (HOSPITAL_BASED_OUTPATIENT_CLINIC_OR_DEPARTMENT_OTHER): Admitting: Physical Therapy

## 2024-02-25 ENCOUNTER — Encounter (HOSPITAL_BASED_OUTPATIENT_CLINIC_OR_DEPARTMENT_OTHER): Payer: Self-pay | Admitting: Physical Therapy

## 2024-02-25 ENCOUNTER — Telehealth: Payer: Self-pay

## 2024-02-25 DIAGNOSIS — M25561 Pain in right knee: Secondary | ICD-10-CM

## 2024-02-25 DIAGNOSIS — M25511 Pain in right shoulder: Secondary | ICD-10-CM | POA: Diagnosis not present

## 2024-02-25 DIAGNOSIS — M6281 Muscle weakness (generalized): Secondary | ICD-10-CM
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
## 2024-02-25 NOTE — Telephone Encounter (Signed)
 Called patient and asked if she was okay with scheduling with a different provider. She stated to say she was fine with scheduling with Dr.Vincent for a sooner appointment. Will keep the appointment for the 7th of August with Dr. Mahlon just in case and we can decide to cancel that appointment when she comes in on 8/5

## 2024-02-25 NOTE — Telephone Encounter (Signed)
 Copied from CRM [id number]. Topic: Appointments - Appointment Scheduling >> Feb 25, 2024  3:33 PM Rosina BIRCH wrote: Patient/patient representative is calling to schedule an appointment. Refer to attachments for appointment information.   Patient called stating she is feeling tightness in her neck and want to get her blood levels and thyroid  checked. Patient want to see if there is a sooner appointment then what she has scheduled which is 8/27 CB [phone]

## 2024-02-29 ENCOUNTER — Ambulatory Visit: Admitting: Family Medicine

## 2024-03-01 ENCOUNTER — Ambulatory Visit: Admitting: Student in an Organized Health Care Education/Training Program

## 2024-03-04 ENCOUNTER — Encounter (HOSPITAL_BASED_OUTPATIENT_CLINIC_OR_DEPARTMENT_OTHER): Payer: Self-pay | Admitting: Physical Therapy

## 2024-03-04 ENCOUNTER — Ambulatory Visit (HOSPITAL_BASED_OUTPATIENT_CLINIC_OR_DEPARTMENT_OTHER): Attending: Family Medicine | Admitting: Physical Therapy

## 2024-03-04 ENCOUNTER — Ambulatory Visit (INDEPENDENT_AMBULATORY_CARE_PROVIDER_SITE_OTHER): Admitting: Family Medicine

## 2024-03-04 ENCOUNTER — Encounter: Payer: Self-pay | Admitting: Family Medicine

## 2024-03-04 VITALS — BP 124/64 | HR 86 | Temp 97.9°F | Ht 65.0 in | Wt 161.0 lb

## 2024-03-04 DIAGNOSIS — M25561 Pain in right knee: Secondary | ICD-10-CM | POA: Insufficient documentation

## 2024-03-04 DIAGNOSIS — E042 Nontoxic multinodular goiter: Secondary | ICD-10-CM | POA: Diagnosis not present

## 2024-03-04 DIAGNOSIS — M6281 Muscle weakness (generalized): Secondary | ICD-10-CM | POA: Insufficient documentation

## 2024-03-04 DIAGNOSIS — M25511 Pain in right shoulder: Secondary | ICD-10-CM | POA: Diagnosis not present

## 2024-03-04 LAB — T3, FREE: T3, Free: 2.7 pg/mL (ref 2.3–4.2)

## 2024-03-04 LAB — T4, FREE: Free T4: 0.71 ng/dL (ref 0.60–1.60)

## 2024-03-04 LAB — TSH: TSH: 2.35 u[IU]/mL (ref 0.35–5.50)

## 2024-03-04 NOTE — Patient Instructions (Signed)
 Follow up as needed or as scheduled We'll notify you of your lab results and make any changes if needed We'll call you to schedule your repeat thyroid  ultrasound Call with any questions or concerns Stay Safe!  Stay Healthy! GOOD LUCK WITH BACK TO SCHOOL!!!

## 2024-03-04 NOTE — Progress Notes (Signed)
   Subjective:    Patient ID: Lauren Guerrero, female    DOB: [birth year], 48 y.o.   MRN: [id number]  HPI Thyroid  concerns- pt reports some tightness in her neck.  Feels it's more noticeable w/ intense heat/humidity.  First noticed in July.  Denies trouble swallowing.  Slight TTP.  Has known thyroid  nodule based on US  from 11/02/23   Review of Systems For ROS see HPI     Objective:   Physical Exam Vitals reviewed.  Constitutional:      General: She is not in acute distress.    Appearance: Normal appearance. She is not ill-appearing.  HENT:     Head: Normocephalic and atraumatic.  Eyes:     Extraocular Movements: Extraocular movements intact.     Conjunctiva/sclera: Conjunctivae normal.  Neck:     Comments: Thyromegaly w/ mild TTP Cardiovascular:     Rate and Rhythm: Normal rate and regular rhythm.  Pulmonary:     Effort: Pulmonary effort is normal. No respiratory distress.  Skin:    General: Skin is warm and dry.  Neurological:     General: No focal deficit present.     Mental Status: She is alert and oriented to person, place, and time.  Psychiatric:        Mood and Affect: Mood normal.        Behavior: Behavior normal.        Thought Content: Thought content normal.           Assessment & Plan:

## 2024-03-04 NOTE — Assessment & Plan Note (Signed)
 Deteriorated.  Pt reports increased fullness, some mild TTP.  Will get labs and even though she just had a thyroid  US  this spring, since she is now symptomatic, will repeat.  Pt expressed understanding and is in agreement w/ plan.
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
## 2024-03-06 ENCOUNTER — Ambulatory Visit: Payer: Self-pay | Admitting: Family Medicine

## 2024-03-09 ENCOUNTER — Other Ambulatory Visit

## 2024-03-11 ENCOUNTER — Encounter (HOSPITAL_BASED_OUTPATIENT_CLINIC_OR_DEPARTMENT_OTHER): Payer: Self-pay | Admitting: Physical Therapy

## 2024-03-11 ENCOUNTER — Ambulatory Visit (HOSPITAL_BASED_OUTPATIENT_CLINIC_OR_DEPARTMENT_OTHER): Admitting: Physical Therapy

## 2024-03-11 DIAGNOSIS — M25511 Pain in right shoulder: Secondary | ICD-10-CM | POA: Diagnosis not present

## 2024-03-11 DIAGNOSIS — M25561 Pain in right knee: Secondary | ICD-10-CM

## 2024-03-11 DIAGNOSIS — M6281 Muscle weakness (generalized): Secondary | ICD-10-CM
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
## 2024-03-17 ENCOUNTER — Encounter (HOSPITAL_BASED_OUTPATIENT_CLINIC_OR_DEPARTMENT_OTHER)

## 2024-03-17 ENCOUNTER — Inpatient Hospital Stay
Admission: RE | Admit: 2024-03-17 | Discharge: 2024-03-17 | Discharge status: Home / Self Care | Source: Ambulatory Visit | Attending: Family Medicine | Admitting: Family Medicine

## 2024-03-17 DIAGNOSIS — E042 Nontoxic multinodular goiter: Secondary | ICD-10-CM

## 2024-03-21 ENCOUNTER — Ambulatory Visit (HOSPITAL_BASED_OUTPATIENT_CLINIC_OR_DEPARTMENT_OTHER): Payer: Self-pay

## 2024-03-23 ENCOUNTER — Ambulatory Visit: Admitting: Family Medicine

## 2024-03-24 NOTE — Progress Notes (Signed)
 Pt has been notified.

## 2024-03-24 NOTE — Progress Notes (Signed)
 Pt has been notified and asked if this should be biopsy at this time? Please advise

## 2024-03-31 ENCOUNTER — Encounter (HOSPITAL_BASED_OUTPATIENT_CLINIC_OR_DEPARTMENT_OTHER): Payer: Self-pay | Admitting: Physical Therapy

## 2024-03-31 ENCOUNTER — Ambulatory Visit (HOSPITAL_BASED_OUTPATIENT_CLINIC_OR_DEPARTMENT_OTHER): Attending: Family Medicine | Admitting: Physical Therapy

## 2024-03-31 DIAGNOSIS — M6281 Muscle weakness (generalized): Secondary | ICD-10-CM | POA: Diagnosis not present

## 2024-03-31 DIAGNOSIS — M25511 Pain in right shoulder: Secondary | ICD-10-CM | POA: Diagnosis not present

## 2024-03-31 DIAGNOSIS — M25561 Pain in right knee: Secondary | ICD-10-CM | POA: Insufficient documentation
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
## 2024-04-07 ENCOUNTER — Ambulatory Visit (HOSPITAL_BASED_OUTPATIENT_CLINIC_OR_DEPARTMENT_OTHER): Admitting: Physical Therapy

## 2024-04-07 DIAGNOSIS — M6281 Muscle weakness (generalized): Secondary | ICD-10-CM

## 2024-04-07 DIAGNOSIS — M25561 Pain in right knee: Secondary | ICD-10-CM

## 2024-04-07 DIAGNOSIS — M25511 Pain in right shoulder: Secondary | ICD-10-CM | POA: Diagnosis not present
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
## 2024-04-15 ENCOUNTER — Ambulatory Visit (HOSPITAL_BASED_OUTPATIENT_CLINIC_OR_DEPARTMENT_OTHER)

## 2024-04-21 DIAGNOSIS — E041 Nontoxic single thyroid nodule: Secondary | ICD-10-CM | POA: Diagnosis not present

## 2024-04-21 DIAGNOSIS — K2100 Gastro-esophageal reflux disease with esophagitis, without bleeding: Secondary | ICD-10-CM | POA: Diagnosis not present

## 2024-04-29 ENCOUNTER — Encounter (HOSPITAL_BASED_OUTPATIENT_CLINIC_OR_DEPARTMENT_OTHER): Payer: Self-pay | Admitting: Physical Therapy

## 2024-04-29 ENCOUNTER — Ambulatory Visit (HOSPITAL_BASED_OUTPATIENT_CLINIC_OR_DEPARTMENT_OTHER): Attending: Family Medicine | Admitting: Physical Therapy

## 2024-04-29 DIAGNOSIS — M25511 Pain in right shoulder: Secondary | ICD-10-CM | POA: Insufficient documentation

## 2024-04-29 DIAGNOSIS — M25561 Pain in right knee: Secondary | ICD-10-CM | POA: Diagnosis not present

## 2024-04-29 DIAGNOSIS — M6281 Muscle weakness (generalized): Secondary | ICD-10-CM | POA: Diagnosis not present
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
## 2024-05-03 ENCOUNTER — Other Ambulatory Visit: Payer: Self-pay | Admitting: Otolaryngology

## 2024-05-03 DIAGNOSIS — E041 Nontoxic single thyroid nodule: Secondary | ICD-10-CM

## 2024-05-26 DIAGNOSIS — Z01419 Encounter for gynecological examination (general) (routine) without abnormal findings: Secondary | ICD-10-CM | POA: Diagnosis not present

## 2024-05-26 DIAGNOSIS — Z124 Encounter for screening for malignant neoplasm of cervix: Secondary | ICD-10-CM | POA: Diagnosis not present

## 2024-06-08 ENCOUNTER — Other Ambulatory Visit (HOSPITAL_COMMUNITY): Payer: Self-pay

## 2024-06-08 MED ORDER — LEVOFLOXACIN 500 MG PO TABS
500.0000 mg | ORAL_TABLET | Freq: Every day | ORAL | 0 refills | Status: DC
  Filled 2024-06-08: qty 7, 7d supply, fill #0

## 2024-06-22 ENCOUNTER — Encounter: Admitting: Family Medicine

## 2024-07-06 ENCOUNTER — Encounter: Payer: Self-pay | Admitting: Family Medicine

## 2024-07-06 ENCOUNTER — Ambulatory Visit: Admitting: Family Medicine

## 2024-07-06 VITALS — BP 124/62 | HR 84 | Temp 98.0°F | Resp 14 | Ht 64.0 in | Wt 161.2 lb

## 2024-07-06 DIAGNOSIS — Z0000 Encounter for general adult medical examination without abnormal findings: Secondary | ICD-10-CM | POA: Diagnosis not present

## 2024-07-06 DIAGNOSIS — E663 Overweight: Secondary | ICD-10-CM | POA: Diagnosis not present

## 2024-07-06 DIAGNOSIS — E559 Vitamin D deficiency, unspecified: Secondary | ICD-10-CM

## 2024-07-06 NOTE — Progress Notes (Unsigned)
° °  Subjective:    Patient ID: Lauren Guerrero, female    DOB: 12/19/1975, 48 y.o.   MRN: [id number]  HPI CPE- UTD on pap, mammo, colonoscopy, Tdap.  Health Maintenance  Topic Date Due   Hepatitis B Vaccines 19-59 Average Risk (1 of 3 - 19+ 3-dose series) Never done   COVID-19 Vaccine (2 - Pfizer risk series) 04/19/2020   Influenza Vaccine  02/26/2024   Cervical Cancer Screening (HPV/Pap Cotest)  03/25/2025   Mammogram  11/30/2025   Colonoscopy  02/14/2027   DTaP/Tdap/Td (3 - Td or Tdap) 06/21/2028   Hepatitis C Screening  Completed   HIV Screening  Completed   Pneumococcal Vaccine  Aged Out   HPV VACCINES  Aged Out   Meningococcal B Vaccine  Aged Out     Patient Care Team    Relationship Specialty Notifications Start End  Mahlon Comer BRAVO, MD PCP - General Family Medicine  10/05/17   Sarrah Browning, MD Consulting Physician Obstetrics and Gynecology  10/05/17      Review of Systems Patient reports no vision/ hearing changes, adenopathy,fever, weight change,  persistant/recurrent hoarseness , swallowing issues, chest pain, palpitations, edema, persistant/recurrent cough, hemoptysis, dyspnea (rest/exertional/paroxysmal nocturnal), gastrointestinal bleeding (melena, rectal bleeding), abdominal pain, significant heartburn, bowel changes, GU symptoms (dysuria, hematuria, incontinence), Gyn symptoms (abnormal  bleeding, pain),  syncope, focal weakness, memory loss, numbness & tingling, skin/hair/nail changes, abnormal bruising or bleeding, anxiety, or depression.     Objective:   Physical Exam General Appearance:    Alert, cooperative, no distress, appears stated age  Head:    Normocephalic, without obvious abnormality, atraumatic  Eyes:    PERRL, conjunctiva/corneas clear, EOM's intact both eyes  Ears:    Normal TM's and external ear canals, both ears  Nose:   Nares normal, septum midline, mucosa normal, no drainage    or sinus tenderness  Throat:   Lips, mucosa, and tongue  normal; teeth and gums normal  Neck:   Supple, symmetrical, trachea midline, no adenopathy;    Thyroid : + L sided nodule  Back:     Symmetric, no curvature, ROM normal, no CVA tenderness  Lungs:     Clear to auscultation bilaterally, respirations unlabored  Chest Wall:    No tenderness or deformity   Heart:    Regular rate and rhythm, S1 and S2 normal, no murmur, rub   or gallop  Breast Exam:    Deferred to GYN  Abdomen:     Soft, non-tender, bowel sounds active all four quadrants,    no masses, no organomegaly  Genitalia:    Deferred to GYN  Rectal:    Extremities:   Extremities normal, atraumatic, no cyanosis or edema  Pulses:   2+ and symmetric all extremities  Skin:   Skin color, texture, turgor normal, no rashes or lesions  Lymph nodes:   Cervical, supraclavicular, and axillary nodes normal  Neurologic:   CNII-XII intact, normal strength, sensation and reflexes    throughout          Assessment & Plan:

## 2024-07-06 NOTE — Patient Instructions (Signed)
 Follow up in 1 year or as needed We'll notify you of your lab results and make any changes if needed Keep up the good work!  You look great! Schedule an appt with Derm or an Aesthetician regarding the small bump (it appears to be a tiny inclusion cyst) IF you need help getting a derm appt, let me know! Call with any questions or concerns Stay Safe!  Stay Healthy! Happy Holidays!!

## 2024-07-07 LAB — CBC WITH DIFFERENTIAL/PLATELET
Basophils Absolute: 0 K/uL (ref 0.0–0.1)
Basophils Relative: 1.3 % (ref 0.0–3.0)
Eosinophils Absolute: 0 K/uL (ref 0.0–0.7)
Eosinophils Relative: 1.2 % (ref 0.0–5.0)
HCT: 37.7 % (ref 36.0–46.0)
Hemoglobin: 12.5 g/dL (ref 12.0–15.0)
Lymphocytes Relative: 45.2 % (ref 12.0–46.0)
Lymphs Abs: 1.7 K/uL (ref 0.7–4.0)
MCHC: 33.2 g/dL (ref 30.0–36.0)
MCV: 87.8 fl (ref 78.0–100.0)
Monocytes Absolute: 0.3 K/uL (ref 0.1–1.0)
Monocytes Relative: 7 % (ref 3.0–12.0)
Neutro Abs: 1.7 K/uL (ref 1.4–7.7)
Neutrophils Relative %: 45.3 % (ref 43.0–77.0)
Platelets: 264 K/uL (ref 150.0–400.0)
RBC: 4.3 Mil/uL (ref 3.87–5.11)
RDW: 13.4 % (ref 11.5–15.5)
WBC: 3.8 K/uL — ABNORMAL LOW (ref 4.0–10.5)

## 2024-07-07 LAB — HEPATIC FUNCTION PANEL
ALT: 14 U/L (ref 0–35)
AST: 19 U/L (ref 0–37)
Albumin: 4.4 g/dL (ref 3.5–5.2)
Alkaline Phosphatase: 39 U/L (ref 39–117)
Bilirubin, Direct: 0.1 mg/dL (ref 0.0–0.3)
Total Bilirubin: 0.5 mg/dL (ref 0.2–1.2)
Total Protein: 7 g/dL (ref 6.0–8.3)

## 2024-07-07 LAB — BASIC METABOLIC PANEL WITH GFR
BUN: 9 mg/dL (ref 6–23)
CO2: 29 meq/L (ref 19–32)
Calcium: 9.1 mg/dL (ref 8.4–10.5)
Chloride: 104 meq/L (ref 96–112)
Creatinine, Ser: 0.71 mg/dL (ref 0.40–1.20)
GFR: 101.16 mL/min (ref >60.00)
Glucose, Bld: 75 mg/dL (ref 70–99)
Potassium: 4.3 meq/L (ref 3.5–5.1)
Sodium: 137 meq/L (ref 135–145)

## 2024-07-07 LAB — LIPID PANEL
Cholesterol: 165 mg/dL (ref 0–200)
HDL: 74.3 mg/dL (ref >39.00)
LDL Cholesterol: 85 mg/dL (ref 0–99)
NonHDL: 90.34
Total CHOL/HDL Ratio: 2
Triglycerides: 28 mg/dL (ref 0.0–149.0)
VLDL: 5.6 mg/dL (ref 0.0–40.0)

## 2024-07-07 LAB — TSH: TSH: 1.6 u[IU]/mL (ref 0.35–5.50)

## 2024-07-07 LAB — VITAMIN D 25 HYDROXY (VIT D DEFICIENCY, FRACTURES): VITD: 27.04 ng/mL — ABNORMAL LOW (ref 30.00–100.00)

## 2024-07-08 ENCOUNTER — Other Ambulatory Visit: Payer: Self-pay

## 2024-07-08 ENCOUNTER — Ambulatory Visit: Payer: Self-pay | Admitting: Family Medicine

## 2024-07-08 ENCOUNTER — Other Ambulatory Visit (HOSPITAL_COMMUNITY): Payer: Self-pay

## 2024-07-08 MED ORDER — VITAMIN D (ERGOCALCIFEROL) 1.25 MG (50000 UNIT) PO CAPS
50000.0000 [IU] | ORAL_CAPSULE | ORAL | 0 refills | Status: AC
  Filled 2024-07-08: qty 12, 84d supply, fill #0

## 2024-07-08 NOTE — Progress Notes (Signed)
 [facility] to discuss labs and starting new medication. If patient calls back please let them know. Thank you

## 2024-07-08 NOTE — Telephone Encounter (Signed)
 Copied from CRM #[id number]. Topic: Clinical - Lab/Test Results >> Jul 08, 2024  3:12 PM Thersia BROCKS wrote: Reason for CRM: Patient called in regarding a missed call from nurse, relay lab results patient understood and had no further questions

## 2024-07-08 NOTE — Progress Notes (Signed)
 Vitamin D  50,000 units was sent to [facility] long.

## 2024-07-09 NOTE — Assessment & Plan Note (Signed)
 Pt's PE unchanged from previous and WNL w/ exception of known thyroid  nodule.  UTD on pap, mammo, colonoscopy, Tdap.  Check labs.  Anticipatory guidance provided.

## 2024-09-07 ENCOUNTER — Encounter: Admitting: Family Medicine

## 2025-07-10 ENCOUNTER — Encounter: Admitting: Family Medicine
